# Patient Record
Sex: Female | Born: 1975 | Race: White | Hispanic: No | Smoking: Current every day smoker
Health system: Southern US, Community
[De-identification: ages and names within clinical notes are randomized; demographics above are authoritative.]

## PROBLEM LIST (undated history)

## (undated) DIAGNOSIS — B009 Herpesviral infection, unspecified: Secondary | ICD-10-CM

## (undated) DIAGNOSIS — K829 Disease of gallbladder, unspecified: Secondary | ICD-10-CM

## (undated) HISTORY — DX: Disease of gallbladder, unspecified: K82.9

## (undated) HISTORY — PX: TONSILLECTOMY: SUR1361

## (undated) HISTORY — PX: OTHER SURGICAL HISTORY: SHX169

## (undated) HISTORY — DX: Herpesviral infection, unspecified: B00.9

---

## 1998-01-29 ENCOUNTER — Other Ambulatory Visit: Admission: RE | Admit: 1998-01-29 | Discharge: 1998-01-29 | Payer: Self-pay | Admitting: *Deleted

## 1998-02-05 ENCOUNTER — Other Ambulatory Visit: Admission: RE | Admit: 1998-02-05 | Discharge: 1998-02-05 | Payer: Self-pay | Admitting: Gynecology

## 1998-05-17 ENCOUNTER — Other Ambulatory Visit: Admission: RE | Admit: 1998-05-17 | Discharge: 1998-05-17 | Payer: Self-pay | Admitting: Gynecology

## 1998-12-24 ENCOUNTER — Other Ambulatory Visit: Admission: RE | Admit: 1998-12-24 | Discharge: 1998-12-24 | Payer: Self-pay | Admitting: Obstetrics and Gynecology

## 1999-05-15 ENCOUNTER — Other Ambulatory Visit: Admission: RE | Admit: 1999-05-15 | Discharge: 1999-05-15 | Payer: Self-pay | Admitting: Obstetrics and Gynecology

## 1999-08-15 ENCOUNTER — Other Ambulatory Visit: Admission: RE | Admit: 1999-08-15 | Discharge: 1999-08-15 | Payer: Self-pay | Admitting: Obstetrics and Gynecology

## 2000-01-05 ENCOUNTER — Other Ambulatory Visit: Admission: RE | Admit: 2000-01-05 | Discharge: 2000-01-05 | Payer: Self-pay | Admitting: Obstetrics and Gynecology

## 2001-01-25 ENCOUNTER — Other Ambulatory Visit: Admission: RE | Admit: 2001-01-25 | Discharge: 2001-01-25 | Payer: Self-pay | Admitting: Obstetrics and Gynecology

## 2002-01-25 ENCOUNTER — Other Ambulatory Visit: Admission: RE | Admit: 2002-01-25 | Discharge: 2002-01-25 | Payer: Self-pay | Admitting: Obstetrics and Gynecology

## 2003-01-23 ENCOUNTER — Other Ambulatory Visit: Admission: RE | Admit: 2003-01-23 | Discharge: 2003-01-23 | Payer: Self-pay | Admitting: Obstetrics and Gynecology

## 2004-01-25 ENCOUNTER — Other Ambulatory Visit: Admission: RE | Admit: 2004-01-25 | Discharge: 2004-01-25 | Payer: Self-pay | Admitting: Obstetrics and Gynecology

## 2005-01-26 ENCOUNTER — Other Ambulatory Visit: Admission: RE | Admit: 2005-01-26 | Discharge: 2005-01-26 | Payer: Self-pay | Admitting: Obstetrics and Gynecology

## 2006-11-17 ENCOUNTER — Ambulatory Visit (HOSPITAL_COMMUNITY): Admission: RE | Admit: 2006-11-17 | Discharge: 2006-11-18 | Payer: Self-pay | Admitting: Orthopedic Surgery

## 2007-03-08 ENCOUNTER — Other Ambulatory Visit: Admission: RE | Admit: 2007-03-08 | Discharge: 2007-03-08 | Payer: Self-pay | Admitting: Gynecology

## 2008-03-08 ENCOUNTER — Other Ambulatory Visit: Admission: RE | Admit: 2008-03-08 | Discharge: 2008-03-08 | Payer: Self-pay | Admitting: Gynecology

## 2008-09-12 ENCOUNTER — Other Ambulatory Visit: Admission: RE | Admit: 2008-09-12 | Discharge: 2008-09-12 | Payer: Self-pay | Admitting: Gynecology

## 2008-09-12 ENCOUNTER — Ambulatory Visit: Payer: Self-pay | Admitting: Gynecology

## 2008-09-12 ENCOUNTER — Encounter: Payer: Self-pay | Admitting: Gynecology

## 2009-10-22 ENCOUNTER — Other Ambulatory Visit: Admission: RE | Admit: 2009-10-22 | Discharge: 2009-10-22 | Payer: Self-pay | Admitting: Gynecology

## 2009-10-22 ENCOUNTER — Ambulatory Visit: Payer: Self-pay | Admitting: Gynecology

## 2010-10-24 ENCOUNTER — Other Ambulatory Visit: Payer: Self-pay | Admitting: Gynecology

## 2010-10-24 ENCOUNTER — Encounter (INDEPENDENT_AMBULATORY_CARE_PROVIDER_SITE_OTHER): Payer: BLUE CROSS/BLUE SHIELD | Admitting: Gynecology

## 2010-10-24 ENCOUNTER — Other Ambulatory Visit (HOSPITAL_COMMUNITY)
Admission: RE | Admit: 2010-10-24 | Discharge: 2010-10-24 | Disposition: A | Discharge status: Home / Self Care | Payer: BLUE CROSS/BLUE SHIELD | Source: Ambulatory Visit | Attending: Gynecology | Admitting: Gynecology

## 2010-10-24 DIAGNOSIS — Z01419 Encounter for gynecological examination (general) (routine) without abnormal findings: Secondary | ICD-10-CM

## 2010-10-24 DIAGNOSIS — Z13220 Encounter for screening for lipoid disorders: Secondary | ICD-10-CM

## 2010-10-24 DIAGNOSIS — Z124 Encounter for screening for malignant neoplasm of cervix: Secondary | ICD-10-CM | POA: Insufficient documentation

## 2010-10-24 DIAGNOSIS — Z113 Encounter for screening for infections with a predominantly sexual mode of transmission: Secondary | ICD-10-CM

## 2010-11-28 NOTE — Op Note (Signed)
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   OPERATIVE PROCEDURE:  Microlaminectomy for diskectomy, L5-S1, CPT code  19147.   COMPLICATIONS:  None.   CONDITION:  Stable.   ESTIMATED BLOOD LOSS:  Minimal blood loss.   HISTORY:  This is a very pleasant, 35 year old woman who complains of  ongoing intermittent low back pain.  Then she developed severe  unrelenting left leg pain.  Attempts at conservative measures consisting  of physiotherapy and injection therapy failed to alleviate her leg pain.  An MRI confirmed the L5-S1 large posterolateral to the left disk  herniation.  After discussing treatment options, he elected to proceed  with surgery mainly because of the leg pain.  All appropriate risks,  benefits and alternatives were explained to the patient.  Consent was  obtained.   OPERATIVE NOTE:  Patient was brought to the operating room and placed  supine on the operating table.  After successful induction of general  anesthesia and endotracheal intubation, TEDs were applied.  She was  turned prone onto the [place] table.  She was then placed in the knee-  chest position with 90 degrees of flexion at the hip and knee.  All bony  prominences were well padded.  The arms were placed overhead and the  back was prepped and draped in sterile fashion.  Two needles were placed  and an x-ray was taken for marker incision placement.   Once we identified the L5-S1 disk space.  I made a small  incision in the  midline of the back.  Sharp dissection was carried out down to the deep  fascia.  The deep fascia was sharply incised and I then stripped the  periosteum to expose the L5 and a portion of the S1 spinous processes.  I then identified the facet capsule.   At this point in time, I placed a [place] 4 underneath the lamina of  what I thought was L5 and took an x-ray.  It turns out it was at L4, so  I migrated inferiorly one level, put the [place] 4 underneath now what  I knew to be the L5 lamina, took a repeat x-ray and confirmed that I was  at the appropriate disk space level.   At this point in time, using a 4 mm Kerrison rongeur, I performed a  laminotomy of L5.  I then sharply incised the ligamentum flavum and then  developed a plane between the ligamentum flavum and underlying dura.  I  then used a 2 and 3 mm Kerrison to resect the ligamentum flavum  to  expose the underlying dura.  The nerve root and dura were displaced  posteriorly due to the very large posterolateral to the left disk  herniation.  This made sweeping the nerve root and dura medially quite  easy.  At this point I could directly visualize the large fragment of  disk material.  I then incised the overlying annulus and removed several  large fragments of disk material.  I then took a micropituitary rongeur  and passed it into the disk space and removed any loose fragments of  disk that were still within the disk space itself.  At this time I could  take a [facility] and freely pass inferiorly, superiorly, medially  and laterally and out the neuroforamen.  There was no impedance to  palpation and the nerve root itself was completely free of any further  undue tension or compression.   At this point I irrigated the wound copiously with normal saline,  checked again with a nerve root retractor as well as a [place] to  confirm that there were no fragments of disk material and then obtained   hemostasis using bipolar electrocautery and then FloSeal.  I then closed  the deep fascia with interrupted 0 Vicryl sutures, superficial with 2-0  Vicryl sutures and a 3-0 Monocryl on the skin.  Steri-Strips and a dry  dressing were applied.  She was then extubated and transferred to the  PACU without incident.  In the PACU she had a negative straight leg  raise test.  She was neurologically intact.      Alvy Beal, MD  Electronically Signed     DDB/MEDQ  D:  11/17/2006  T:  11/17/2006  Job:  [id number]

## 2011-11-25 ENCOUNTER — Telehealth: Payer: Self-pay | Admitting: *Deleted

## 2011-11-25 MED ORDER — VALACYCLOVIR HCL 500 MG PO TABS: 500.0000 mg | ORAL_TABLET | Freq: Two times a day (BID) | ORAL | Status: DC

## 2011-11-25 NOTE — Telephone Encounter (Signed)
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

## 2011-11-25 NOTE — Telephone Encounter (Signed)
Beltran 100 mg twice a day x5 days for HSV outbreak refill x2 #10

## 2011-11-25 NOTE — Telephone Encounter (Signed)
Pt informed with the below. 

## 2011-12-09 ENCOUNTER — Encounter: Payer: Self-pay | Admitting: *Deleted

## 2011-12-11 ENCOUNTER — Encounter: Payer: Self-pay | Admitting: Gynecology

## 2011-12-11 ENCOUNTER — Ambulatory Visit (INDEPENDENT_AMBULATORY_CARE_PROVIDER_SITE_OTHER): Payer: BC Managed Care – PPO | Admitting: Gynecology

## 2011-12-11 VITALS — BP 114/70 | Ht 58.75 in | Wt 85.0 lb

## 2011-12-11 DIAGNOSIS — Z13220 Encounter for screening for lipoid disorders: Secondary | ICD-10-CM

## 2011-12-11 DIAGNOSIS — Z01419 Encounter for gynecological examination (general) (routine) without abnormal findings: Secondary | ICD-10-CM

## 2011-12-11 DIAGNOSIS — Z113 Encounter for screening for infections with a predominantly sexual mode of transmission: Secondary | ICD-10-CM

## 2011-12-11 LAB — LIPID PANEL
HDL: 95 mg/dL (ref >39)
Triglycerides: 53 mg/dL (ref <150)

## 2011-12-11 NOTE — Patient Instructions (Signed)
Follow up in one year for annual exam 

## 2011-12-11 NOTE — Progress Notes (Signed)
Katherine Kidd 12-03-1975 [id number]        36 y.o.  for annual exam.  Several issues noted below  Past medical history,surgical history, medications, allergies, family history and social history were all reviewed and documented in the EPIC chart. ROS:  Was performed and pertinent positives and negatives are included in the history.  Exam: Sherrilyn Rist chaperone present Filed Vitals:   12/11/11 0859  BP: 114/70   General appearance  Normal Skin grossly normal Head/Neck normal with no cervical or supraclavicular adenopathy thyroid normal Lungs  clear Cardiac RR, without RMG Abdominal  soft, nontender, without masses, organomegaly or hernia Breasts  examined lying and sitting without masses, retractions, discharge or axillary adenopathy. Pelvic  Ext/BUS/vagina  normal   Cervix  normal GC Chlamydia screen  Uterus  anteverted, normal size, shape and contour, midline and mobile nontender   Adnexa  Without masses or tenderness    Anus and perineum  normal   Rectovaginal  normal sphincter tone without palpated masses or tenderness.    Assessment/Plan:  36 y.o. female for annual exam.    1. Recent miscarriage. Patient relates having regular menses then skipped in December, had a positive pregnancy test and then had heavy bleeding with resumption of normal regular monthly menses. Using withdrawal birth control. We discussed contraceptive options as in the past and she declines. She is in a relationship now with planned marriage and anticipates pregnancy relatively soon.  She is on a multivitamin with folic acid and will continue on this. She continues to smoke and again I reviewed stop smoking with her not only from a general health standpoint or from a pregnancy standpoint. 2. STD screening. Patient requests GC and Chlamydia screen. She has no known exposure but just wants to be screened. 3. Pap smear. No Pap smear was done today. Last Pap smear 2012. She has no history of abnormal Pap smears with  numerous normal records in her chart. We'll plan less frequent screening per current screening guidelines every 3-5 years. 4. Mammography. I discussed screening guidelines between 35 and 40. Patient has no strong family history prefers to wait closer to 40. SBE monthly reviewed. 5. Left axillary nodularity. Patient had a small sebaceous cyst in her left axillary area on exam last year which is gone this year. She'll continue with self exams and as long as it remains gone and will follow. 6. Health maintenance. She had a normal glucose and CBC last year.  Her cholesterol was borderline but her HDL was in the 90s and the remainder of her profile normal. We'll repeat lipid profile now along with urinalysis. Otherwise assuming she continues well from a gynecologic standpoint she will see me in a year, sooner as needed.   Dara Lords MD, 9:29 AM 12/11/2011

## 2011-12-12 LAB — URINALYSIS W MICROSCOPIC + REFLEX CULTURE
Casts: NONE SEEN
Hgb urine dipstick: NEGATIVE
Leukocytes, UA: NEGATIVE
Nitrite: NEGATIVE
Protein, ur: NEGATIVE mg/dL
pH: 5.5 (ref 5.0–8.0)

## 2011-12-12 LAB — GC/CHLAMYDIA PROBE AMP, GENITAL
Chlamydia, DNA Probe: NEGATIVE
GC Probe Amp, Genital: NEGATIVE

## 2012-02-29 ENCOUNTER — Telehealth: Payer: Self-pay | Admitting: *Deleted

## 2012-02-29 MED ORDER — VALACYCLOVIR HCL 500 MG PO TABS: 500.0000 mg | ORAL_TABLET | Freq: Every day | ORAL | Status: DC

## 2012-02-29 NOTE — Telephone Encounter (Signed)
Okay with Valtrex 500 mg daily #30 refill x6. If patient attempting pregnancy she needs to stop this before she gets pregnant or as soon as she finds out she is pregnant

## 2012-02-29 NOTE — Telephone Encounter (Signed)
Pt informed with the below note. 

## 2012-02-29 NOTE — Telephone Encounter (Signed)
Pt is calling requesting to have suppressive therapy for her valtrex 500 mg tablet due to outbreaks. Please advise

## 2012-03-16 ENCOUNTER — Encounter: Payer: Self-pay | Admitting: Gynecology

## 2012-03-16 ENCOUNTER — Ambulatory Visit (INDEPENDENT_AMBULATORY_CARE_PROVIDER_SITE_OTHER): Payer: BC Managed Care – PPO | Admitting: Gynecology

## 2012-03-16 DIAGNOSIS — A499 Bacterial infection, unspecified: Secondary | ICD-10-CM

## 2012-03-16 DIAGNOSIS — N898 Other specified noninflammatory disorders of vagina: Secondary | ICD-10-CM

## 2012-03-16 DIAGNOSIS — L293 Anogenital pruritus, unspecified: Secondary | ICD-10-CM

## 2012-03-16 DIAGNOSIS — B9689 Other specified bacterial agents as the cause of diseases classified elsewhere: Secondary | ICD-10-CM

## 2012-03-16 DIAGNOSIS — N760 Acute vaginitis: Secondary | ICD-10-CM

## 2012-03-16 LAB — WET PREP FOR TRICH, YEAST, CLUE

## 2012-03-16 MED ORDER — TINIDAZOLE 500 MG PO TABS: 2.0000 g | ORAL_TABLET | Freq: Once | ORAL | Status: AC

## 2012-03-16 MED ORDER — FLUCONAZOLE 200 MG PO TABS: 200.0000 mg | ORAL_TABLET | Freq: Every day | ORAL | Status: AC

## 2012-03-16 NOTE — Progress Notes (Signed)
Patient presents with several days of vaginal itching. Used OTC antifungal but symptoms seem worse.  Past medical history,surgical history, medications, allergies, family history and social history were all reviewed and documented in the EPIC chart. ROS:  Was performed and pertinent positives and negatives are included in the history.  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 Cervix normal. Uterus normal sized and mobile nontender. Adnexa without masses or tenderness.  Assessment and plan: Exam, history and wet prep consistent with BV. We'll treat with Tindamax 2 g daily x2 doses, alcohol avoidance reviewed. Given the itching and she is going away tomorrow on a vacation going to cover her with Diflucan 200 mg daily x2 days for yeast. Follow up if symptoms persist or recur.

## 2012-03-16 NOTE — Patient Instructions (Addendum)
Take Tindamax 4 pills one day and 4 pills next day. Avoid alcohol. Take Diflucan one pill daily for 2 days. Follow up if symptoms persist or recur.

## 2012-11-30 ENCOUNTER — Other Ambulatory Visit: Payer: Self-pay | Admitting: Gastroenterology

## 2012-11-30 DIAGNOSIS — R1011 Right upper quadrant pain: Secondary | ICD-10-CM

## 2012-11-30 DIAGNOSIS — R1013 Epigastric pain: Secondary | ICD-10-CM

## 2012-12-07 ENCOUNTER — Ambulatory Visit
Admission: RE | Admit: 2012-12-07 | Discharge: 2012-12-07 | Disposition: A | Discharge status: Home / Self Care | Payer: BC Managed Care – PPO | Source: Ambulatory Visit | Attending: Gastroenterology | Admitting: Gastroenterology

## 2012-12-07 DIAGNOSIS — R1011 Right upper quadrant pain: Secondary | ICD-10-CM

## 2012-12-07 DIAGNOSIS — R1013 Epigastric pain: Secondary | ICD-10-CM

## 2012-12-16 ENCOUNTER — Encounter: Payer: Self-pay | Admitting: Gynecology

## 2012-12-26 ENCOUNTER — Ambulatory Visit (INDEPENDENT_AMBULATORY_CARE_PROVIDER_SITE_OTHER): Payer: BC Managed Care – PPO | Admitting: Gynecology

## 2012-12-26 ENCOUNTER — Encounter: Payer: Self-pay | Admitting: Gynecology

## 2012-12-26 VITALS — BP 110/66 | Ht <= 58 in | Wt 83.0 lb

## 2012-12-26 DIAGNOSIS — Z13220 Encounter for screening for lipoid disorders: Secondary | ICD-10-CM

## 2012-12-26 DIAGNOSIS — A6000 Herpesviral infection of urogenital system, unspecified: Secondary | ICD-10-CM

## 2012-12-26 DIAGNOSIS — N63 Unspecified lump in unspecified breast: Secondary | ICD-10-CM

## 2012-12-26 DIAGNOSIS — Z01419 Encounter for gynecological examination (general) (routine) without abnormal findings: Secondary | ICD-10-CM

## 2012-12-26 LAB — CBC WITH DIFFERENTIAL/PLATELET
Basophils Absolute: 0 10*3/uL (ref 0.0–0.1)
Basophils Relative: 0 % (ref 0–1)
Eosinophils Absolute: 0.1 10*3/uL (ref 0.0–0.7)
Eosinophils Relative: 1 % (ref 0–5)
HCT: 38.6 % (ref 36.0–46.0)
Hemoglobin: 13.1 g/dL (ref 12.0–15.0)
Lymphocytes Relative: 40 % (ref 12–46)
Monocytes Absolute: 0.8 10*3/uL (ref 0.1–1.0)
Monocytes Relative: 9 % (ref 3–12)
Neutro Abs: 4.7 10*3/uL (ref 1.7–7.7)
RBC: 4.11 MIL/uL (ref 3.87–5.11)
WBC: 9.3 10*3/uL (ref 4.0–10.5)

## 2012-12-26 MED ORDER — VALACYCLOVIR HCL 500 MG PO TABS: 500.0000 mg | ORAL_TABLET | Freq: Every day | ORAL | Status: DC

## 2012-12-26 NOTE — Progress Notes (Addendum)
Patient ID: Katherine Kidd, female   DOB: 1975-12-02, 37 y.o.   MRN: [id number] Katherine Kidd 1975-07-28 [id number]        37 y.o.  G1P0010 for annual exam.  Several issues noted below.  Past medical history,surgical history, medications, allergies, family history and social history were all reviewed and documented in the EPIC chart.  ROS:  Performed and pertinent positives and negatives are included in the history, assessment and plan .  Exam: Kim assistant Filed Vitals:   12/26/12 1548  BP: 110/66  Height: 4\' 10"  (1.473 m)  Weight: 83 lb (37.649 kg)   General appearance  Normal Skin grossly normal Head/Neck normal with no cervical or supraclavicular adenopathy thyroid normal Lungs  clear Cardiac RR, without RMG Abdominal  soft, nontender, without masses, organomegaly or hernia Breasts  examined lying and sitting.  Left without masses, retractions, discharge or axillary adenopathy.  Right 1 cm soft mass 2 fingerbreadths off the areola 10 to 11:00 position no overlying skin changes nipple discharge or axillary adenopathy. Physical Exam  Pulmonary/Chest:      Pelvic  Ext/BUS/vagina  normal   Cervix  normal   Uterus  anteverted, normal size, shape and contour, midline and mobile nontender   Adnexa  Without masses or tenderness    Anus and perineum  normal   Rectovaginal  normal sphincter tone without palpated masses or tenderness.    Assessment/Plan:  37 y.o. G38P0010 female for annual exam.   1. Right breast mass noted for 6 months. Overall unchanged during observation period. Start with mammogram ultrasound. Patient knows that we'll help arrange this and she will followup for this. 2. Contraception. Continues to use rhythm method. She understands and accepts the failure risk sent declines alternative birth control. Is getting married in 2 months. Recommended multivitamin with folic acid now. 3. Stop smoking discussed and encouraged. 4. Pap smear 2012. No Pap smear done today.  No history of significant abnormal Pap smears. Plan repeat Pap smear next year a 3 year interval. 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 I refilled her times a year. 6. Health maintenance. Baseline CBC comprehensive metabolic panel lipid profile urinalysis ordered. Being evaluated for gallbladder discomfort. Had negative ultrasound and is planning Hida scan. Followup for mammogram ultrasound otherwise one year, sooner as needed.     Dara Lords MD, 4:12 PM 12/26/2012

## 2012-12-26 NOTE — Patient Instructions (Signed)
Office will contact you to schedule mammogram and ultrasound of the breast.

## 2012-12-27 ENCOUNTER — Telehealth: Payer: Self-pay | Admitting: *Deleted

## 2012-12-27 DIAGNOSIS — N6310 Unspecified lump in the right breast, unspecified quadrant: Secondary | ICD-10-CM

## 2012-12-27 LAB — COMPREHENSIVE METABOLIC PANEL
ALT: 12 U/L (ref 0–35)
Alkaline Phosphatase: 52 U/L (ref 39–117)
CO2: 25 mEq/L (ref 19–32)
Creat: 0.62 mg/dL (ref 0.50–1.10)
Sodium: 137 mEq/L (ref 135–145)
Total Bilirubin: 0.5 mg/dL (ref 0.3–1.2)

## 2012-12-27 LAB — URINALYSIS W MICROSCOPIC + REFLEX CULTURE
Bacteria, UA: NONE SEEN
Bilirubin Urine: NEGATIVE
Ketones, ur: NEGATIVE mg/dL
Nitrite: NEGATIVE
Protein, ur: NEGATIVE mg/dL
Urobilinogen, UA: 0.2 mg/dL (ref 0.0–1.0)

## 2012-12-27 LAB — LIPID PANEL
Cholesterol: 160 mg/dL (ref 0–200)
LDL Cholesterol: 62 mg/dL (ref 0–99)
Total CHOL/HDL Ratio: 1.8 Ratio
VLDL: 9 mg/dL (ref 0–40)

## 2012-12-27 NOTE — Telephone Encounter (Signed)
ORDERS PLACE FOR BREAST CENTER THEY WILL CONTACT PT WITH TIME AND DATE.

## 2012-12-27 NOTE — Telephone Encounter (Signed)
Message copied by Aura Camps on Tue Dec 27, 2012 12:00 PM ------      Message from: Dara Lords      Created: Mon Dec 26, 2012  8:38 PM       Schedule Mammogram/ultrasound at breast center re: 1 cm right breast mass 10-11oclock position 2 fingers off areola. ------

## 2012-12-29 NOTE — Telephone Encounter (Signed)
appt 01/20/13 @ 9:10 am

## 2012-12-30 ENCOUNTER — Emergency Department: Payer: Self-pay | Admitting: Emergency Medicine

## 2013-01-20 ENCOUNTER — Other Ambulatory Visit: Payer: BC Managed Care – PPO

## 2013-01-27 ENCOUNTER — Ambulatory Visit
Admission: RE | Admit: 2013-01-27 | Discharge: 2013-01-27 | Disposition: A | Discharge status: Home / Self Care | Payer: BC Managed Care – PPO | Source: Ambulatory Visit | Attending: Gynecology | Admitting: Gynecology

## 2013-01-27 DIAGNOSIS — N6310 Unspecified lump in the right breast, unspecified quadrant: Secondary | ICD-10-CM

## 2013-06-30 ENCOUNTER — Telehealth: Payer: Self-pay | Admitting: *Deleted

## 2013-06-30 DIAGNOSIS — O3680X0 Pregnancy with inconclusive fetal viability, not applicable or unspecified: Secondary | ICD-10-CM

## 2013-06-30 NOTE — Telephone Encounter (Signed)
Pt found out she is about [redacted] weeks pregnant, LMP:Nov 18 th, c/o some very light brownish discharge noticed last night, light back pain. I explained not uncommon to have some discharge,but I would also relay information to you as well. Please advise

## 2013-06-30 NOTE — Telephone Encounter (Signed)
Pt informed with the below note, order placed for ultrasound.

## 2013-06-30 NOTE — Telephone Encounter (Signed)
Recommend following at present. Suggest scheduling ultrasound in 2 weeks which would make her around 6 weeks for viability check. Call back if heavier bleeding or more pain.

## 2013-07-14 ENCOUNTER — Encounter: Payer: Self-pay | Admitting: Gynecology

## 2013-07-14 ENCOUNTER — Ambulatory Visit (INDEPENDENT_AMBULATORY_CARE_PROVIDER_SITE_OTHER): Payer: BC Managed Care – PPO

## 2013-07-14 ENCOUNTER — Ambulatory Visit (INDEPENDENT_AMBULATORY_CARE_PROVIDER_SITE_OTHER): Payer: BC Managed Care – PPO | Admitting: Gynecology

## 2013-07-14 DIAGNOSIS — N912 Amenorrhea, unspecified: Secondary | ICD-10-CM

## 2013-07-14 DIAGNOSIS — O3680X0 Pregnancy with inconclusive fetal viability, not applicable or unspecified: Secondary | ICD-10-CM

## 2013-07-14 DIAGNOSIS — O200 Threatened abortion: Secondary | ICD-10-CM

## 2013-07-14 DIAGNOSIS — Z8759 Personal history of other complications of pregnancy, childbirth and the puerperium: Principal | ICD-10-CM

## 2013-07-14 LAB — US OB TRANSVAGINAL

## 2013-07-14 NOTE — Progress Notes (Signed)
Patient presents for viability ultrasound. She is 6 weeks by LMP and had called 2 weeks ago noting some spotting which has resolved.  She's having no cramping or bleeding. She was scheduled for an ultrasound now at 6 weeks. 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 She was to followup in 6 months which is now for repeat imaging.  Ultrasound shows viable IUP at 6 weeks. No evidence of bleeding. Cervix long and closed. Patient [facility] [place] [facility] [place] [facility] [place] [facility] [place] [facility] within the next 4 weeks and has a copy of the ultrasound report. I did not reexamine her given that she is not bleeding. She also notes the nodularity that she had felt previously in the right tail of Mliss SaxSpence is gone. I recommended that we'll contact the mammogram facility and arrange for a followup ultrasound just to relook at the retroareolar area but would hold on the mammogram at this time and she agrees with this. She is on a prenatal vitamin every day we'll continue to do so. She is attempting to stop smoking and I encouraged her to do so.

## 2013-07-14 NOTE — Patient Instructions (Signed)
Followup with obstetrician within the next 4 weeks. Continue on the prenatal vitamin. Continue with attempting to stop smoking.

## 2013-07-17 ENCOUNTER — Telehealth: Payer: Self-pay | Admitting: *Deleted

## 2013-07-17 DIAGNOSIS — Z09 Encounter for follow-up examination after completed treatment for conditions other than malignant neoplasm: Secondary | ICD-10-CM

## 2013-07-17 NOTE — Telephone Encounter (Signed)
Message copied by Aura CampsWEBB, JENNIFER L on Mon Jul 17, 2013  9:10 AM ------      Message from: Dara LordsFONTAINE, TIMOTHY P      Created: Fri Jul 14, 2013  3:19 PM       Arrange with radiology for a followup ultrasound right breast. They had talked about doing repeat imaging at 6 months from July but she now is early pregnant and I do not think a mammogram at this point should be done but they can relook at the area with ultrasound. ------

## 2013-07-17 NOTE — Telephone Encounter (Signed)
Orders placed for breast center they will contact patient. 

## 2013-07-20 NOTE — Telephone Encounter (Signed)
Appointment 07/21/13

## 2013-07-21 ENCOUNTER — Ambulatory Visit
Admission: RE | Admit: 2013-07-21 | Discharge: 2013-07-21 | Disposition: A | Discharge status: Home / Self Care | Payer: BC Managed Care – PPO | Source: Ambulatory Visit | Attending: Gynecology | Admitting: Gynecology

## 2013-07-21 DIAGNOSIS — Z09 Encounter for follow-up examination after completed treatment for conditions other than malignant neoplasm: Secondary | ICD-10-CM

## 2013-08-16 ENCOUNTER — Other Ambulatory Visit: Payer: Self-pay

## 2013-08-16 LAB — OB RESULTS CONSOLE HIV ANTIBODY (ROUTINE TESTING): HIV: NONREACTIVE

## 2013-08-16 LAB — OB RESULTS CONSOLE ABO/RH: RH Type: POSITIVE

## 2013-08-16 LAB — OB RESULTS CONSOLE RPR: RPR: NONREACTIVE

## 2013-08-16 LAB — OB RESULTS CONSOLE HEPATITIS B SURFACE ANTIGEN: Hepatitis B Surface Ag: NEGATIVE

## 2013-08-16 LAB — OB RESULTS CONSOLE ANTIBODY SCREEN: ANTIBODY SCREEN: NEGATIVE

## 2013-08-16 LAB — OB RESULTS CONSOLE RUBELLA ANTIBODY, IGM: Rubella: IMMUNE

## 2013-08-16 LAB — OB RESULTS CONSOLE GC/CHLAMYDIA
Chlamydia: NEGATIVE
GC PROBE AMP, GENITAL: NEGATIVE

## 2013-08-30 ENCOUNTER — Other Ambulatory Visit (HOSPITAL_COMMUNITY): Payer: Self-pay | Admitting: Obstetrics and Gynecology

## 2013-08-30 ENCOUNTER — Encounter (HOSPITAL_COMMUNITY): Payer: Self-pay

## 2013-08-30 ENCOUNTER — Ambulatory Visit (HOSPITAL_COMMUNITY)
Admission: RE | Admit: 2013-08-30 | Discharge: 2013-08-30 | Disposition: A | Discharge status: Home / Self Care | Payer: BC Managed Care – PPO | Source: Ambulatory Visit | Attending: Obstetrics and Gynecology | Admitting: Obstetrics and Gynecology

## 2013-08-30 VITALS — BP 131/67 | HR 100 | Wt 97.0 lb

## 2013-08-30 DIAGNOSIS — IMO0002 Reserved for concepts with insufficient information to code with codable children: Secondary | ICD-10-CM

## 2013-08-30 DIAGNOSIS — O99330 Smoking (tobacco) complicating pregnancy, unspecified trimester: Secondary | ICD-10-CM | POA: Insufficient documentation

## 2013-08-30 DIAGNOSIS — O09529 Supervision of elderly multigravida, unspecified trimester: Secondary | ICD-10-CM | POA: Insufficient documentation

## 2013-08-30 DIAGNOSIS — O358XX0 Maternal care for other (suspected) fetal abnormality and damage, not applicable or unspecified: Secondary | ICD-10-CM | POA: Insufficient documentation

## 2013-09-01 NOTE — Progress Notes (Signed)
Genetic Counseling  High-Risk Gestation Note  Appointment Date:  09/01/2013 Referred By: Cheri Fowler, MD Date of Birth:  03-19-1976 Partner:  Octavia Bruckner   Pregnancy History: G2P0010 Estimated Date of Delivery: 03/06/14 Estimated Gestational Age: [redacted]w[redacted]d I met with Mrs. CKenna Gilbertfor genetic counseling regarding an abnormal ultrasound finding.  Mrs. Guldin's had a routine nuchal translucency ultrasound through her primary obstetrician's office, which revealed an increased nuchal translucency versus a cystic hygroma.  [facility], and genetic counseling.  A limited ultrasound was performed today.  The report will be documented separately.  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  Mrs. Ciotti was counseled that a cystic hygroma describes a septated fluid filled sac at the back of the neck that typically results from failure of the fetal lymphatic system.  We discussed the various etiologies for a hygroma including normal variation (immature lymphatic system), a chromosome condition, single gene condition, or a congenital anomaly (heart defect).  We discussed chromosomes, non-disjunction, age related risks for aneuploidy (1%), and the 50% ultrasound adjusted risk for a chromosome condition based on the finding of a cystic hygroma.  We discussed the common features of Down syndrome, Turner syndrome, and trisomies 13 and 18.  Mrs. Cue previously had noninvasive prenatal screening (NIPS)/cell free fetal DNA (cffDNA) testing through her obstetrician's office.  We reviewed that while this testing has a high sensitivity and specificity, it is not considered to be diagnostic.  For trisomy 232and trisomy 1101 the detection rate is 99%; for trisomy 18 the detection rate is 96%, and for Turner syndrome, the  detection rate is ~92%.  We reviewed Mrs. Depriest's normal Panorama results and the associated significant reduction in risks for fetal aneuploidy.  We also discussed that cystic hygromas can also be caused by a variety of other chromosome aberrations including: microdeletions, microduplications, and translocations.  She was counseled that NIPS/cffDNA testing can detect a few microdeletions, but not those most commonly associated with cystic hygromas or abnormal nuchal translucency measurements.  We reviewed the availability of diagnostic testing (chromosome analysis) via [facility] and amniocentesis.  She was counseled regarding the associated risks for complications for each.  Additionally, we discussed that microarray analysis can be performed on cells obtained from amniocentesis.  Mrs. Colegrove was counseled that microarray analysis is a molecular based technique in which a test sample of DNA (fetal) is compared to a reference (normal) genome in order to determine if the test sample has any extra or missing genetic information.  Microarray analysis allows for the detection of genetic deletions and duplications that are 1270times smaller than those identified by routine chromosome analysis.  We discussed that recent publications show that approximately 6% of patients with an abnormal fetal ultrasound and a normal fetal karyotype had a significant microdeletion/microduplication detected by prenatal microarray analysis.  After consideration of all the options, Mrs. LAmakerexpressed interest in having an amniocentesis for both chromosome and microarray analysis.  She was scheduled to return on September 15, 2013 for ultrasound and amniocentesis.    We also discussed that a cystic hygroma can be a feature of many underlying single gene conditions, such as Noonan syndrome, SLOS, and skeletal dysplasias.  We discussed that prenatal diagnosis is available for some single gene conditions, but that in most cases targeted analysis is  recommended by a  medical geneticist following a post-natal physical exam and review of medical records.  If however, ultrasound findings or a positive family history strongly suggest an increased risk for a specific condition, testing may be performed prenatally.  Mrs. Dezeeuw was scheduled to have an ultrasound on the day of her amniocentesis and will return for a more detailed ultrasound at ~[redacted] weeks gestation.  If a specific condition is suspected, we discussed that single gene testing can be performed with DNA isolated from the cells obtained from the amniocentesis.  Additionally, we discussed that congenital heart defects (CHD) are commonly associated with cystic hygromas.  The prevalence of a CHD is in the order of 6 times higher in fetuses with a cystic hygroma/increased nuchal translucency as compared to an unselected population.  There does not appear to be an association between any specific CHD and cystic hygromas.   We discussed that the chance for a CHD increases exponentially with increasing NT measurements.  For this reason, as well as the finding of a SUA, we discussed the option of a fetal echocardiogram.  She was scheduled to have an early ([redacted] weeks gestation) fetal echocardiogram on September 20, 2013 at 2:00.  She understands that the fetal heart may be difficult to fully visualize at that gestational age, and that she will likely need a follow-up appointment later in the pregnancy.      We discussed that the fetal prognosis is largely dependent upon the underlying cause of the hygroma, but that there are indications by ultrasound, such as hydrops, which significantly increase the likelihood of a poor prognosis.  She understands that cystic hygromas may worsen and progress to hydrops fetalis, improve and regress, or remain unchanged at follow up ultrasounds.    Mrs. Atiyeh was counseled that the majority of fetuses with a normal karyotype, normal detailed anatomy ultrasound, and normal fetal  echocardiogram will have an uneventful outcome.  She understands that while many of the causes of a cystic hygroma can be investigated during pregnancy, not all conditions and causes can be determined prenatally.    We discussed the option of continuing the pregnancy versus termination of pregnancy.  Mrs. Erhart wishes to investigate as many causes as possible before making a decision.    Both family histories were reviewed and found to be noncontributory for birth defects, mental retardation, and known genetic conditions. Without further information regarding the provided family history, an accurate genetic risk cannot be calculated. Further genetic counseling is warranted if more information is obtained.  Ms. Amburn was provided with written information regarding cystic fibrosis (CF) including the carrier frequency and incidence in the Caucasian population, the availability of carrier testing and prenatal diagnosis if indicated.  In addition, we discussed that CF is routinely screened for as part of the [place] newborn screening panel.  She declined testing today.   Ms. Manso denied exposure to environmental toxins or chemical agents. She denied the use of alcohol, tobacco or street drugs. She denied significant viral illnesses during the course of her pregnancy.    I counseled Mrs. Vandenberg regarding the above risks and available options.  The approximate face-to-face time with the genetic counselor was 62 minutes.  Sharyne Richters, MS Certified Genetic Counselor

## 2013-09-15 ENCOUNTER — Ambulatory Visit (HOSPITAL_COMMUNITY)
Admission: RE | Admit: 2013-09-15 | Discharge: 2013-09-15 | Disposition: A | Discharge status: Home / Self Care | Payer: BC Managed Care – PPO | Source: Ambulatory Visit | Attending: Obstetrics and Gynecology | Admitting: Obstetrics and Gynecology

## 2013-09-15 ENCOUNTER — Other Ambulatory Visit: Payer: Self-pay

## 2013-09-15 DIAGNOSIS — O358XX0 Maternal care for other (suspected) fetal abnormality and damage, not applicable or unspecified: Secondary | ICD-10-CM | POA: Insufficient documentation

## 2013-09-15 DIAGNOSIS — O99330 Smoking (tobacco) complicating pregnancy, unspecified trimester: Secondary | ICD-10-CM | POA: Insufficient documentation

## 2013-09-15 DIAGNOSIS — IMO0002 Reserved for concepts with insufficient information to code with codable children: Secondary | ICD-10-CM

## 2013-09-15 DIAGNOSIS — O09529 Supervision of elderly multigravida, unspecified trimester: Secondary | ICD-10-CM | POA: Insufficient documentation

## 2013-09-15 LAB — AP-AFP (ALPHA FETOPROTEIN)

## 2013-09-15 LAB — ROUTINE CHROMOSOME - KARYOTYPE + FISH

## 2013-09-20 ENCOUNTER — Telehealth (HOSPITAL_COMMUNITY): Payer: Self-pay

## 2013-09-20 ENCOUNTER — Other Ambulatory Visit: Payer: Self-pay

## 2013-09-20 NOTE — Telephone Encounter (Signed)
Mrs. Damaris SchoonerLepore had an early fetal echo today and called to let me know that the results were normal.  The heart was well visualized and no follow up was requested.  Report pending.  Mrs. Paschen does not wish to terminate at this time.  She wishes to await the microarray results and the detailed anatomy u/s.  Discussed that [facility] today and that when they call to schedule the D&E, she can let them know that she has changed her mind.  All questions answered.  She was encouraged to call with additional questions/ concerns.  Donald Prosehristy S. Kalyssa Anker, MS Certified Genetic Counselor

## 2013-09-20 NOTE — Telephone Encounter (Signed)
Pt. Called today inquiring about options for TAB.  She is concerned that the cystic hygroma plus the new u/s finding of clubfeet is concerning for an underlying genetic condition.  [facility], as expected based on the nl Panorama results, but that normal FISH results do not rule out all genetic conditions.  We reviewed that the final karyotype and microarray analysis can evaluate the fetus for other chromosome aberrations, but not specific single gene conditions.  We discussed that the echo on Wednesday, 09/20/13, may shed more light on whether or not the cystic hygroma was the result of a CHD, and that the detailed anatomy ultrasound can help evaluate the fetus for specific single gene conditions.  If features by u/s are suggestive of a specific condition at that time, further testing can be considered. We discussed that it will likely take many weeks to gather this information, and that even then we may not have a specific diagnosis or understand the cause of the fetal differences.  Katherine Kidd was understandably upset and asked many questions.  [facility] (lower cost due to procedure being performed outside of a hospital setting).  She is going to move forward with the echo on 09/20/13.  She is not certain whether or not she wishes to await the karyotype/microarray results before scheduling a TOP.  I contacted [facility], at Katherine Kidd's request, and provided the patient's information.  They will call her to schedule the procedure.  She will decide at that time whether or not she wishes to move forward with continuing or TOP.  She very much wants to have a plan in place, but is not sure what she wants to do. I encouraged her to gather as much information as possible to assist her in her decision making.  All questions answered. The patient was encouraged to call with any additional questions/concerns.   Donald Prosehristy S. Venda Dice, MS Certified Genetic Counselor

## 2013-09-28 ENCOUNTER — Other Ambulatory Visit (HOSPITAL_COMMUNITY): Payer: Self-pay | Admitting: Obstetrics and Gynecology

## 2013-09-28 DIAGNOSIS — O358XX0 Maternal care for other (suspected) fetal abnormality and damage, not applicable or unspecified: Secondary | ICD-10-CM

## 2013-09-28 DIAGNOSIS — O99340 Other mental disorders complicating pregnancy, unspecified trimester: Secondary | ICD-10-CM

## 2013-09-28 DIAGNOSIS — O09529 Supervision of elderly multigravida, unspecified trimester: Secondary | ICD-10-CM

## 2013-09-29 ENCOUNTER — Telehealth (HOSPITAL_COMMUNITY): Payer: Self-pay

## 2013-09-29 NOTE — Telephone Encounter (Signed)
Left message for patient with her normal fetal chromosome analysis results (via amniocentesis).  Encouraged patient to call with questions or concerns.  U/s next week and microarray results pending. Despina Ariashristy Zaryiah Barz, MS Certified Genetic Counselor

## 2013-10-02 ENCOUNTER — Other Ambulatory Visit: Payer: Self-pay

## 2013-10-05 ENCOUNTER — Ambulatory Visit (HOSPITAL_COMMUNITY)
Admission: RE | Admit: 2013-10-05 | Discharge: 2013-10-05 | Disposition: A | Discharge status: Home / Self Care | Payer: BC Managed Care – PPO | Source: Ambulatory Visit | Attending: Obstetrics and Gynecology | Admitting: Obstetrics and Gynecology

## 2013-10-05 DIAGNOSIS — O99330 Smoking (tobacco) complicating pregnancy, unspecified trimester: Secondary | ICD-10-CM | POA: Insufficient documentation

## 2013-10-05 DIAGNOSIS — O09529 Supervision of elderly multigravida, unspecified trimester: Secondary | ICD-10-CM

## 2013-10-05 DIAGNOSIS — O99340 Other mental disorders complicating pregnancy, unspecified trimester: Secondary | ICD-10-CM

## 2013-10-05 DIAGNOSIS — O358XX0 Maternal care for other (suspected) fetal abnormality and damage, not applicable or unspecified: Secondary | ICD-10-CM

## 2013-10-12 ENCOUNTER — Telehealth (HOSPITAL_COMMUNITY): Payer: Self-pay

## 2013-10-12 NOTE — Telephone Encounter (Signed)
Called Katherine Kidd to discuss her prenatal microarray results.  Testing was offered because of abnormal ultrasound findings following a normal fetal karyotype.   The patient was identified by name and DOB.  We reviewed that these are within normal limits.  No microdeletions or microduplications were detected.  We discussed that this technology can identify many, but not all, chromosome aberrations.  We also discussed that this technology does not identify single gene conditions. [name] has a f/u ultrasound on October 19, 2013.  We discussed that no single gene condition is recognized at this time.  The molecular lab will isolate and store fetal DNA, so that further testing can be performed at a later date, if indicated.  All questions were answered to her satisfaction, she was encouraged to call with additional questions or concerns.  Despina AriasSTANLEY, Tamella Tuccillo, MS Certified Genetic Counselor

## 2013-10-13 ENCOUNTER — Other Ambulatory Visit (HOSPITAL_COMMUNITY): Payer: Self-pay | Admitting: Obstetrics and Gynecology

## 2013-10-13 DIAGNOSIS — O358XX0 Maternal care for other (suspected) fetal abnormality and damage, not applicable or unspecified: Secondary | ICD-10-CM

## 2013-10-13 DIAGNOSIS — O99340 Other mental disorders complicating pregnancy, unspecified trimester: Secondary | ICD-10-CM

## 2013-10-13 DIAGNOSIS — O09529 Supervision of elderly multigravida, unspecified trimester: Secondary | ICD-10-CM

## 2013-10-19 ENCOUNTER — Ambulatory Visit (HOSPITAL_COMMUNITY)
Admission: RE | Admit: 2013-10-19 | Discharge: 2013-10-19 | Disposition: A | Discharge status: Home / Self Care | Payer: BC Managed Care – PPO | Source: Ambulatory Visit | Attending: Obstetrics and Gynecology | Admitting: Obstetrics and Gynecology

## 2013-10-19 VITALS — BP 107/62 | HR 95 | Wt 99.0 lb

## 2013-10-19 DIAGNOSIS — O09529 Supervision of elderly multigravida, unspecified trimester: Secondary | ICD-10-CM | POA: Insufficient documentation

## 2013-10-19 DIAGNOSIS — O358XX0 Maternal care for other (suspected) fetal abnormality and damage, not applicable or unspecified: Secondary | ICD-10-CM

## 2013-10-19 DIAGNOSIS — O99330 Smoking (tobacco) complicating pregnancy, unspecified trimester: Secondary | ICD-10-CM | POA: Insufficient documentation

## 2013-10-19 DIAGNOSIS — O99340 Other mental disorders complicating pregnancy, unspecified trimester: Secondary | ICD-10-CM

## 2013-11-17 ENCOUNTER — Ambulatory Visit (HOSPITAL_COMMUNITY)
Admission: RE | Admit: 2013-11-17 | Discharge: 2013-11-17 | Disposition: A | Discharge status: Home / Self Care | Payer: BC Managed Care – PPO | Source: Ambulatory Visit | Attending: Maternal and Fetal Medicine | Admitting: Maternal and Fetal Medicine

## 2013-11-17 ENCOUNTER — Encounter (HOSPITAL_COMMUNITY): Payer: Self-pay

## 2013-11-17 DIAGNOSIS — Z3689 Encounter for other specified antenatal screening: Secondary | ICD-10-CM | POA: Insufficient documentation

## 2013-11-17 DIAGNOSIS — O358XX0 Maternal care for other (suspected) fetal abnormality and damage, not applicable or unspecified: Secondary | ICD-10-CM | POA: Insufficient documentation

## 2013-11-20 ENCOUNTER — Other Ambulatory Visit (HOSPITAL_COMMUNITY): Payer: Self-pay | Admitting: Obstetrics and Gynecology

## 2013-11-20 DIAGNOSIS — O99340 Other mental disorders complicating pregnancy, unspecified trimester: Secondary | ICD-10-CM

## 2013-11-20 DIAGNOSIS — O358XX0 Maternal care for other (suspected) fetal abnormality and damage, not applicable or unspecified: Secondary | ICD-10-CM

## 2013-11-20 DIAGNOSIS — O09529 Supervision of elderly multigravida, unspecified trimester: Secondary | ICD-10-CM

## 2013-12-15 ENCOUNTER — Other Ambulatory Visit (HOSPITAL_COMMUNITY): Payer: Self-pay | Admitting: Obstetrics and Gynecology

## 2013-12-15 ENCOUNTER — Ambulatory Visit (HOSPITAL_COMMUNITY)
Admission: RE | Admit: 2013-12-15 | Discharge: 2013-12-15 | Disposition: A | Discharge status: Home / Self Care | Payer: BC Managed Care – PPO | Source: Ambulatory Visit | Attending: Obstetrics and Gynecology | Admitting: Obstetrics and Gynecology

## 2013-12-15 ENCOUNTER — Encounter (HOSPITAL_COMMUNITY): Payer: Self-pay

## 2013-12-15 VITALS — BP 119/65 | HR 113 | Wt 106.0 lb

## 2013-12-15 DIAGNOSIS — O99340 Other mental disorders complicating pregnancy, unspecified trimester: Secondary | ICD-10-CM

## 2013-12-15 DIAGNOSIS — O09529 Supervision of elderly multigravida, unspecified trimester: Secondary | ICD-10-CM

## 2013-12-15 DIAGNOSIS — O358XX0 Maternal care for other (suspected) fetal abnormality and damage, not applicable or unspecified: Secondary | ICD-10-CM

## 2014-01-02 ENCOUNTER — Encounter (HOSPITAL_COMMUNITY): Payer: Self-pay

## 2014-01-15 ENCOUNTER — Other Ambulatory Visit (HOSPITAL_COMMUNITY): Payer: Self-pay | Admitting: Obstetrics and Gynecology

## 2014-01-15 ENCOUNTER — Ambulatory Visit (HOSPITAL_COMMUNITY)
Admission: RE | Admit: 2014-01-15 | Discharge: 2014-01-15 | Disposition: A | Discharge status: Home / Self Care | Payer: BC Managed Care – PPO | Source: Ambulatory Visit | Attending: Obstetrics and Gynecology | Admitting: Obstetrics and Gynecology

## 2014-01-15 ENCOUNTER — Encounter (HOSPITAL_COMMUNITY): Payer: Self-pay

## 2014-01-15 VITALS — BP 110/68 | HR 102 | Wt 111.0 lb

## 2014-01-15 DIAGNOSIS — O09529 Supervision of elderly multigravida, unspecified trimester: Secondary | ICD-10-CM

## 2014-01-15 DIAGNOSIS — O358XX0 Maternal care for other (suspected) fetal abnormality and damage, not applicable or unspecified: Secondary | ICD-10-CM | POA: Insufficient documentation

## 2014-01-15 DIAGNOSIS — O99340 Other mental disorders complicating pregnancy, unspecified trimester: Secondary | ICD-10-CM | POA: Insufficient documentation

## 2014-02-02 LAB — OB RESULTS CONSOLE GBS: STREP GROUP B AG: POSITIVE

## 2014-02-16 ENCOUNTER — Ambulatory Visit (HOSPITAL_COMMUNITY)
Admission: RE | Admit: 2014-02-16 | Discharge: 2014-02-16 | Disposition: A | Discharge status: Home / Self Care | Payer: BC Managed Care – PPO | Source: Ambulatory Visit | Attending: Obstetrics and Gynecology | Admitting: Obstetrics and Gynecology

## 2014-02-16 ENCOUNTER — Encounter (HOSPITAL_COMMUNITY): Payer: Self-pay

## 2014-02-16 VITALS — BP 136/70 | HR 96 | Wt 110.0 lb

## 2014-02-16 DIAGNOSIS — O358XX0 Maternal care for other (suspected) fetal abnormality and damage, not applicable or unspecified: Secondary | ICD-10-CM | POA: Insufficient documentation

## 2014-03-04 ENCOUNTER — Inpatient Hospital Stay (HOSPITAL_COMMUNITY)
Admission: AD | Admit: 2014-03-04 | Discharge: 2014-03-08 | DRG: 765 | Disposition: A | Discharge status: Home / Self Care | Payer: BC Managed Care – PPO | Source: Ambulatory Visit | Attending: Obstetrics and Gynecology | Admitting: Obstetrics and Gynecology

## 2014-03-04 ENCOUNTER — Encounter (HOSPITAL_COMMUNITY): Payer: Self-pay | Admitting: *Deleted

## 2014-03-04 DIAGNOSIS — Z22330 Carrier of Group B streptococcus: Secondary | ICD-10-CM | POA: Diagnosis not present

## 2014-03-04 DIAGNOSIS — O98519 Other viral diseases complicating pregnancy, unspecified trimester: Secondary | ICD-10-CM | POA: Diagnosis present

## 2014-03-04 DIAGNOSIS — O99334 Smoking (tobacco) complicating childbirth: Secondary | ICD-10-CM | POA: Diagnosis present

## 2014-03-04 DIAGNOSIS — Z833 Family history of diabetes mellitus: Secondary | ICD-10-CM | POA: Diagnosis not present

## 2014-03-04 DIAGNOSIS — O99892 Other specified diseases and conditions complicating childbirth: Secondary | ICD-10-CM | POA: Diagnosis present

## 2014-03-04 DIAGNOSIS — A6000 Herpesviral infection of urogenital system, unspecified: Secondary | ICD-10-CM | POA: Diagnosis present

## 2014-03-04 DIAGNOSIS — O9989 Other specified diseases and conditions complicating pregnancy, childbirth and the puerperium: Secondary | ICD-10-CM

## 2014-03-04 DIAGNOSIS — Z8249 Family history of ischemic heart disease and other diseases of the circulatory system: Secondary | ICD-10-CM | POA: Diagnosis not present

## 2014-03-04 DIAGNOSIS — O365990 Maternal care for other known or suspected poor fetal growth, unspecified trimester, not applicable or unspecified: Principal | ICD-10-CM | POA: Diagnosis present

## 2014-03-04 DIAGNOSIS — O359XX0 Maternal care for (suspected) fetal abnormality and damage, unspecified, not applicable or unspecified: Secondary | ICD-10-CM | POA: Diagnosis present

## 2014-03-04 DIAGNOSIS — O358XX0 Maternal care for other (suspected) fetal abnormality and damage, not applicable or unspecified: Secondary | ICD-10-CM | POA: Diagnosis present

## 2014-03-04 DIAGNOSIS — Z803 Family history of malignant neoplasm of breast: Secondary | ICD-10-CM

## 2014-03-04 LAB — CBC
HCT: 34.6 % — ABNORMAL LOW (ref 36.0–46.0)
Hemoglobin: 12.2 g/dL (ref 12.0–15.0)
MCH: 31.9 pg (ref 26.0–34.0)
MCHC: 35.3 g/dL (ref 30.0–36.0)
MCV: 90.3 fL (ref 78.0–100.0)
PLATELETS: 249 10*3/uL (ref 150–400)
RBC: 3.83 MIL/uL — ABNORMAL LOW (ref 3.87–5.11)
RDW: 13.6 % (ref 11.5–15.5)
WBC: 12.9 10*3/uL — ABNORMAL HIGH (ref 4.0–10.5)

## 2014-03-04 MED ORDER — LACTATED RINGERS IV SOLN
INTRAVENOUS | Status: DC
  Administered 2014-03-04 – 2014-03-06 (×8): via INTRAVENOUS

## 2014-03-04 MED ORDER — BUTORPHANOL TARTRATE 1 MG/ML IJ SOLN: 1.0000 mg | INTRAMUSCULAR | Status: DC | PRN

## 2014-03-04 MED ORDER — OXYTOCIN 40 UNITS IN LACTATED RINGERS INFUSION - SIMPLE MED: 62.5000 mL/h | INTRAVENOUS | Status: DC

## 2014-03-04 MED ORDER — TERBUTALINE SULFATE 1 MG/ML IJ SOLN: 0.2500 mg | Freq: Once | INTRAMUSCULAR | Status: AC | PRN

## 2014-03-04 MED ORDER — OXYTOCIN BOLUS FROM INFUSION: 500.0000 mL | INTRAVENOUS | Status: DC

## 2014-03-04 MED ORDER — LACTATED RINGERS IV SOLN
500.0000 mL | INTRAVENOUS | Status: DC | PRN
  Administered 2014-03-05: 300 mL via INTRAVENOUS
  Administered 2014-03-05: 500 mL via INTRAVENOUS

## 2014-03-04 MED ORDER — ZOLPIDEM TARTRATE 5 MG PO TABS: 5.0000 mg | ORAL_TABLET | Freq: Every evening | ORAL | Status: DC | PRN

## 2014-03-04 MED ORDER — LIDOCAINE HCL (PF) 1 % IJ SOLN: 30.0000 mL | INTRAMUSCULAR | Status: DC | PRN

## 2014-03-04 MED ORDER — ONDANSETRON HCL 4 MG/2ML IJ SOLN
4.0000 mg | Freq: Four times a day (QID) | INTRAMUSCULAR | Status: DC | PRN
  Filled 2014-03-04: qty 2

## 2014-03-04 MED ORDER — CITRIC ACID-SODIUM CITRATE 334-500 MG/5ML PO SOLN
30.0000 mL | ORAL | Status: DC | PRN
  Administered 2014-03-06: 30 mL via ORAL
  Filled 2014-03-04: qty 15

## 2014-03-04 MED ORDER — ACETAMINOPHEN 325 MG PO TABS: 650.0000 mg | ORAL_TABLET | ORAL | Status: DC | PRN

## 2014-03-04 MED ORDER — IBUPROFEN 600 MG PO TABS: 600.0000 mg | ORAL_TABLET | Freq: Four times a day (QID) | ORAL | Status: DC | PRN

## 2014-03-04 MED ORDER — CLINDAMYCIN PHOSPHATE 900 MG/50ML IV SOLN
900.0000 mg | Freq: Three times a day (TID) | INTRAVENOUS | Status: DC
  Administered 2014-03-04 – 2014-03-05 (×4): 900 mg via INTRAVENOUS
  Filled 2014-03-04 (×6): qty 50

## 2014-03-04 MED ORDER — MISOPROSTOL 25 MCG QUARTER TABLET
25.0000 ug | ORAL_TABLET | ORAL | Status: DC | PRN
  Administered 2014-03-04 – 2014-03-05 (×2): 25 ug via VAGINAL
  Filled 2014-03-04 (×2): qty 0.25

## 2014-03-04 MED ORDER — OXYCODONE-ACETAMINOPHEN 5-325 MG PO TABS: 1.0000 | ORAL_TABLET | ORAL | Status: DC | PRN

## 2014-03-05 ENCOUNTER — Inpatient Hospital Stay (HOSPITAL_COMMUNITY): Payer: BC Managed Care – PPO | Admitting: Anesthesiology

## 2014-03-05 ENCOUNTER — Encounter (HOSPITAL_COMMUNITY): Payer: BC Managed Care – PPO | Admitting: Anesthesiology

## 2014-03-05 LAB — RPR

## 2014-03-05 MED ORDER — FENTANYL 2.5 MCG/ML BUPIVACAINE 1/10 % EPIDURAL INFUSION (WH - ANES)
14.0000 mL/h | INTRAMUSCULAR | Status: DC | PRN
  Administered 2014-03-05: 14 mL/h via EPIDURAL
  Administered 2014-03-05: 12 mL/h via EPIDURAL
  Filled 2014-03-05 (×2): qty 125

## 2014-03-05 MED ORDER — LIDOCAINE HCL (PF) 1 % IJ SOLN
INTRAMUSCULAR | Status: DC | PRN
  Administered 2014-03-05: 10 mL

## 2014-03-05 MED ORDER — LACTATED RINGERS IV SOLN
500.0000 mL | Freq: Once | INTRAVENOUS | Status: AC
  Administered 2014-03-05: 500 mL via INTRAVENOUS

## 2014-03-05 MED ORDER — DIPHENHYDRAMINE HCL 50 MG/ML IJ SOLN: 12.5000 mg | INTRAMUSCULAR | Status: DC | PRN

## 2014-03-05 MED ORDER — PHENYLEPHRINE 40 MCG/ML (10ML) SYRINGE FOR IV PUSH (FOR BLOOD PRESSURE SUPPORT): 80.0000 ug | PREFILLED_SYRINGE | INTRAVENOUS | Status: DC | PRN

## 2014-03-05 MED ORDER — VALACYCLOVIR HCL 500 MG PO TABS
500.0000 mg | ORAL_TABLET | Freq: Two times a day (BID) | ORAL | Status: DC
  Administered 2014-03-05 (×2): 500 mg via ORAL
  Filled 2014-03-05 (×3): qty 1

## 2014-03-05 MED ORDER — PHENYLEPHRINE 40 MCG/ML (10ML) SYRINGE FOR IV PUSH (FOR BLOOD PRESSURE SUPPORT)
80.0000 ug | PREFILLED_SYRINGE | INTRAVENOUS | Status: DC | PRN
  Filled 2014-03-05: qty 10

## 2014-03-05 MED ORDER — OXYTOCIN 40 UNITS IN LACTATED RINGERS INFUSION - SIMPLE MED
1.0000 m[IU]/min | INTRAVENOUS | Status: DC
  Administered 2014-03-05: 14 m[IU]/min via INTRAVENOUS
  Administered 2014-03-05: 12 m[IU]/min via INTRAVENOUS
  Administered 2014-03-05: 2 m[IU]/min via INTRAVENOUS
  Administered 2014-03-05: 10 m[IU]/min via INTRAVENOUS
  Administered 2014-03-05: 8 m[IU]/min via INTRAVENOUS
  Administered 2014-03-05: 6 m[IU]/min via INTRAVENOUS
  Administered 2014-03-05: 4 m[IU]/min via INTRAVENOUS
  Filled 2014-03-05: qty 1000

## 2014-03-05 MED ORDER — EPHEDRINE 5 MG/ML INJ: 10.0000 mg | INTRAVENOUS | Status: DC | PRN

## 2014-03-05 MED ORDER — FENTANYL 2.5 MCG/ML BUPIVACAINE 1/10 % EPIDURAL INFUSION (WH - ANES): 12.0000 mL/h | INTRAMUSCULAR | Status: DC | PRN

## 2014-03-05 NOTE — Progress Notes (Signed)
Comfortable Afeb, VSS FHT- Cat I, ctx q 2-3 min VE-3/70/-1, vtx Continue pitocin and Clinda

## 2014-03-05 NOTE — H&P (Signed)
Katherine Kidd is a 38 y.o. female, G2 P1001, EGA 39+ weeks with EDC 8-25 presenting for cervical ripening and induction due to SGA and possible fetal anomaly.  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  MFM following, recently doing biweekly AFI and weekly UA dopplers in the office along with NSTs, all looks good.  Cervix recently borderline favorable, pt requests ripening and induction, and I discussed this with Dr. Otho Kidd and he agreed that ripening and induction was acceptable.  Prenatal care also complicated by h/o HSV, no symptoms or lesions, on Valtrex for the past month for suppression.  See prenatal records for complete history.  Maternal Medical History:  Fetal activity: Perceived fetal activity is normal.      OB History   Grav Para Term Preterm Abortions TAB SAB Ect Mult Living   2 0   1  1   0     Past Medical History  Diagnosis Date  . HSV-2 infection    Past Surgical History  Procedure Laterality Date  . Tonsillectomy    . Septo rhinoplasty    . Lamienectomy      L5-S1   Family History: family history includes Breast cancer in her paternal aunt; Breast cancer (age of onset: 43) in her maternal aunt; Breast cancer (age of onset: 51) in her mother; Cancer in her father and maternal aunt; Diabetes in her paternal grandfather and paternal grandmother; Heart disease in her maternal grandfather and maternal uncle; Hypertension in her brother and mother. Social History:  reports that she has been smoking.  She has never used smokeless tobacco. She reports that she drinks about one ounce of alcohol per week. She reports that she does not use illicit drugs.   Prenatal Transfer Tool  Maternal Diabetes: No Genetic Screening: Normal Maternal Ultrasounds/Referrals: Abnormal:  Findings:   Other: Fetal Ultrasounds or other Referrals:  Referred to Materal Fetal Medicine  Maternal Substance Abuse:   No Significant Maternal Medications:  Meds include: Other:  Significant Maternal Lab Results:  Lab values include: Group B Strep positive Other Comments:  early cystic hygroma, MFM followed for micrognathia, bilateral club feet, Panorama low risk female, on Valtrex suppression for HSV  Review of Systems  Respiratory: Negative.   Cardiovascular: Negative.     Dilation: 1.5 Effacement (%): 50 Station: -2 Exam by:: Dr. Jackelyn Kidd Blood pressure 103/67, pulse 79, temperature 98.2 F (36.8 C), temperature source Oral, resp. rate 20, height  (1.473 m), weight 50.349 kg (111 lb), last menstrual period 05/30/2013. Maternal Exam:  Uterine Assessment: Contraction strength is mild.  Contraction frequency is irregular.   Abdomen: Patient reports no abdominal tenderness. Estimated fetal weight is 6 lbs.   Fetal presentation: vertex  Introitus: Normal vulva. Normal vagina.  Amniotic fluid character: not assessed.  Pelvis: adequate for delivery.   Cervix: Cervix evaluated by digital exam.     Fetal Exam Fetal Monitor Review: Mode: ultrasound.   Baseline rate: 140.  Variability: moderate (6-25 bpm).   Pattern: accelerations present and no decelerations.    Fetal State Assessment: Category I - tracings are normal.     Physical Exam  Vitals reviewed. Constitutional: She appears well-developed and well-nourished.  Cardiovascular: Normal rate, regular rhythm and normal heart sounds.   No murmur heard. Respiratory: Effort normal and breath sounds normal. No respiratory distress. She has no wheezes.  GI: Soft.    Prenatal labs: ABO, Rh: O/Positive/-- (02/04  0000) Antibody: Negative (02/04 0000) Rubella: Immune (02/04 0000) RPR: NON REAC (08/23 2225)  HBsAg: Negative (02/04 0000)  HIV: Non-reactive (02/04 0000)  GBS: Positive (07/24 0000)  GCT:  129  Assessment/Plan: IUP at 39+ weeks with SGA fetus getting biweekly antenatal testing with borderline favorable cervix.  MFM  agrees with attempt at cervical ripening and induction.  Received 2 doses of cytotec overnight, nurse exam said she changed form 1 to 2-3 cm, but she is only 1-2 cm by my exam.  Will try pitocin and possible AROM when more dilated.  Continue clindamycin for +GBS.     Katherine Kidd D 03/05/2014, 8:21 AM

## 2014-03-05 NOTE — Anesthesia Preprocedure Evaluation (Signed)

## 2014-03-05 NOTE — Anesthesia Procedure Notes (Signed)
Epidural Patient location during procedure: OB  Preanesthetic Checklist Completed: patient identified, site marked, surgical consent, pre-op evaluation, timeout performed, IV checked, risks and benefits discussed and monitors and equipment checked  Epidural Patient position: sitting Prep: site prepped and draped and DuraPrep Patient monitoring: continuous pulse ox and blood pressure Approach: midline Injection technique: LOR air  Needle:  Needle type: Tuohy  Needle gauge: 17 G Needle length: 9 cm and 9 Needle insertion depth: 4 cm Catheter type: closed end flexible Catheter size: 19 Gauge Catheter at skin depth: 9 cm Test dose: negative  Assessment Events: blood not aspirated, injection not painful, no injection resistance, negative IV test and no paresthesia  Additional Notes Dosing of Epidural:  1st dose, through catheter .............................................  Xylocaine 40 mg  2nd dose, through catheter, after waiting 3 minutes.........Xylocaine 60 mg    ( 1% Xylo charted as a single dose in Epic Meds for ease of charting; actual dosing was fractionated as above, for saftey's sake)  As each dose occurred, patient was free of IV sx; and patient exhibited no evidence of SA injection.  Patient is more comfortable after epidural dosed. Please see RN's note for documentation of vital signs,and FHR which are stable.  Patient reminded not to try to ambulate with numb legs, and that an RN must be present when she attempts to get up.       

## 2014-03-05 NOTE — Progress Notes (Signed)
Comfortable Afeb, VSS FHT- Cat I, ctx q 2-3 min VE-4/80/-1, vtx Continue pitocin and Clinda, monitor progress, still in latent labor

## 2014-03-05 NOTE — Progress Notes (Signed)
Comfortable with epidural Afeb, VSS FHT- Cat I, ctx q 2-3 min VE-2/60/-2, vtx, AROM clear Continue pitocin and monitor progress, continue Clinda for +GBS

## 2014-03-06 ENCOUNTER — Encounter (HOSPITAL_COMMUNITY)
Admission: AD | Disposition: A | Discharge status: Home / Self Care | Payer: Self-pay | Source: Ambulatory Visit | Attending: Obstetrics and Gynecology

## 2014-03-06 ENCOUNTER — Encounter (HOSPITAL_COMMUNITY): Payer: Self-pay | Admitting: Anesthesiology

## 2014-03-06 LAB — ABO/RH: ABO/RH(D): O POS

## 2014-03-06 SURGERY — Surgical Case
Anesthesia: Epidural | Site: Abdomen

## 2014-03-06 MED ORDER — KETOROLAC TROMETHAMINE 30 MG/ML IJ SOLN
INTRAMUSCULAR | Status: AC
  Filled 2014-03-06: qty 1

## 2014-03-06 MED ORDER — MENTHOL 3 MG MT LOZG: 1.0000 | LOZENGE | OROMUCOSAL | Status: DC | PRN

## 2014-03-06 MED ORDER — ONDANSETRON HCL 4 MG/2ML IJ SOLN
INTRAMUSCULAR | Status: AC
  Filled 2014-03-06: qty 2

## 2014-03-06 MED ORDER — ONDANSETRON HCL 4 MG PO TABS
4.0000 mg | ORAL_TABLET | ORAL | Status: DC | PRN
Start: 2014-03-06 — End: 2014-03-08

## 2014-03-06 MED ORDER — ONDANSETRON HCL 4 MG/2ML IJ SOLN
INTRAMUSCULAR | Status: DC | PRN
  Administered 2014-03-06: 4 mg via INTRAVENOUS

## 2014-03-06 MED ORDER — LANOLIN HYDROUS EX OINT
1.0000 | TOPICAL_OINTMENT | CUTANEOUS | Status: DC | PRN
Start: 2014-03-06 — End: 2014-03-08

## 2014-03-06 MED ORDER — LACTATED RINGERS IV SOLN
INTRAVENOUS | Status: DC
  Administered 2014-03-06: 16:00:00 via INTRAVENOUS

## 2014-03-06 MED ORDER — ONDANSETRON HCL 4 MG/2ML IJ SOLN
4.0000 mg | Freq: Three times a day (TID) | INTRAMUSCULAR | Status: DC | PRN
Start: 2014-03-06 — End: 2014-03-08

## 2014-03-06 MED ORDER — BUPIVACAINE IN DEXTROSE 0.75-8.25 % IT SOLN
INTRATHECAL | Status: AC
  Filled 2014-03-06: qty 2

## 2014-03-06 MED ORDER — MAGNESIUM HYDROXIDE 400 MG/5ML PO SUSP: 30.0000 mL | ORAL | Status: DC | PRN

## 2014-03-06 MED ORDER — WITCH HAZEL-GLYCERIN EX PADS: 1.0000 "application " | MEDICATED_PAD | CUTANEOUS | Status: DC | PRN

## 2014-03-06 MED ORDER — MEPERIDINE HCL 25 MG/ML IJ SOLN: 6.2500 mg | INTRAMUSCULAR | Status: DC | PRN

## 2014-03-06 MED ORDER — SENNOSIDES-DOCUSATE SODIUM 8.6-50 MG PO TABS
2.0000 | ORAL_TABLET | ORAL | Status: DC
Start: 2014-03-07 — End: 2014-03-08
  Administered 2014-03-08: 2 via ORAL
  Filled 2014-03-06 (×2): qty 2

## 2014-03-06 MED ORDER — ZOLPIDEM TARTRATE 5 MG PO TABS: 5.0000 mg | ORAL_TABLET | Freq: Every evening | ORAL | Status: DC | PRN

## 2014-03-06 MED ORDER — LIDOCAINE-EPINEPHRINE (PF) 2 %-1:200000 IJ SOLN
INTRAMUSCULAR | Status: AC
  Filled 2014-03-06: qty 20

## 2014-03-06 MED ORDER — SODIUM BICARBONATE 8.4 % IV SOLN
INTRAVENOUS | Status: DC | PRN
  Administered 2014-03-06: 3 mL via EPIDURAL
  Administered 2014-03-06: 10 mL via EPIDURAL

## 2014-03-06 MED ORDER — DIBUCAINE 1 % RE OINT: 1.0000 "application " | TOPICAL_OINTMENT | RECTAL | Status: DC | PRN

## 2014-03-06 MED ORDER — PROMETHAZINE HCL 25 MG/ML IJ SOLN: 6.2500 mg | INTRAMUSCULAR | Status: DC | PRN

## 2014-03-06 MED ORDER — SIMETHICONE 80 MG PO CHEW
80.0000 mg | CHEWABLE_TABLET | ORAL | Status: DC | PRN
  Administered 2014-03-06 – 2014-03-07 (×3): 80 mg via ORAL
  Filled 2014-03-06 (×3): qty 1

## 2014-03-06 MED ORDER — IBUPROFEN 600 MG PO TABS
600.0000 mg | ORAL_TABLET | Freq: Four times a day (QID) | ORAL | Status: DC
  Administered 2014-03-06 – 2014-03-08 (×7): 600 mg via ORAL
  Filled 2014-03-06 (×9): qty 1

## 2014-03-06 MED ORDER — MORPHINE SULFATE (PF) 0.5 MG/ML IJ SOLN
INTRAMUSCULAR | Status: DC | PRN
  Administered 2014-03-06: 300 ug via INTRAVENOUS
  Administered 2014-03-06: 200 ug via INTRAVENOUS

## 2014-03-06 MED ORDER — OXYTOCIN 10 UNIT/ML IJ SOLN
40.0000 [IU] | INTRAVENOUS | Status: DC | PRN
  Administered 2014-03-06: 40 [IU] via INTRAVENOUS

## 2014-03-06 MED ORDER — NALBUPHINE HCL 10 MG/ML IJ SOLN: 5.0000 mg | INTRAMUSCULAR | Status: DC | PRN

## 2014-03-06 MED ORDER — SCOPOLAMINE 1 MG/3DAYS TD PT72
1.0000 | MEDICATED_PATCH | Freq: Once | TRANSDERMAL | Status: DC
  Administered 2014-03-06: 1.5 mg via TRANSDERMAL

## 2014-03-06 MED ORDER — CEFAZOLIN SODIUM-DEXTROSE 2-3 GM-% IV SOLR
INTRAVENOUS | Status: DC | PRN
  Administered 2014-03-06: 2 g via INTRAVENOUS

## 2014-03-06 MED ORDER — HYDROMORPHONE HCL PF 1 MG/ML IJ SOLN: 0.2500 mg | INTRAMUSCULAR | Status: DC | PRN

## 2014-03-06 MED ORDER — SODIUM BICARBONATE 8.4 % IV SOLN
INTRAVENOUS | Status: AC
  Filled 2014-03-06: qty 50

## 2014-03-06 MED ORDER — TETANUS-DIPHTH-ACELL PERTUSSIS 5-2.5-18.5 LF-MCG/0.5 IM SUSP
0.5000 mL | Freq: Once | INTRAMUSCULAR | Status: DC
Start: 2014-03-07 — End: 2014-03-08

## 2014-03-06 MED ORDER — ONDANSETRON HCL 4 MG/2ML IJ SOLN
4.0000 mg | INTRAMUSCULAR | Status: DC | PRN
Start: 2014-03-06 — End: 2014-03-08

## 2014-03-06 MED ORDER — MEPERIDINE HCL 25 MG/ML IJ SOLN
INTRAMUSCULAR | Status: AC
Start: 2014-03-06 — End: 2014-03-06
  Filled 2014-03-06: qty 1

## 2014-03-06 MED ORDER — PHENYLEPHRINE HCL 10 MG/ML IJ SOLN
INTRAMUSCULAR | Status: DC | PRN
  Administered 2014-03-06: 80 ug via INTRAVENOUS

## 2014-03-06 MED ORDER — DIPHENHYDRAMINE HCL 50 MG/ML IJ SOLN: 12.5000 mg | INTRAMUSCULAR | Status: DC | PRN

## 2014-03-06 MED ORDER — IBUPROFEN 600 MG PO TABS
600.0000 mg | ORAL_TABLET | Freq: Four times a day (QID) | ORAL | Status: DC | PRN
  Administered 2014-03-06 – 2014-03-08 (×2): 600 mg via ORAL

## 2014-03-06 MED ORDER — METOCLOPRAMIDE HCL 5 MG/ML IJ SOLN: 10.0000 mg | Freq: Three times a day (TID) | INTRAMUSCULAR | Status: DC | PRN

## 2014-03-06 MED ORDER — NALOXONE HCL 1 MG/ML IJ SOLN: 1.0000 ug/kg/h | INTRAVENOUS | Status: DC | PRN

## 2014-03-06 MED ORDER — NALOXONE HCL 0.4 MG/ML IJ SOLN: 0.4000 mg | INTRAMUSCULAR | Status: DC | PRN

## 2014-03-06 MED ORDER — DIPHENHYDRAMINE HCL 25 MG PO CAPS: 25.0000 mg | ORAL_CAPSULE | Freq: Four times a day (QID) | ORAL | Status: DC | PRN

## 2014-03-06 MED ORDER — KETOROLAC TROMETHAMINE 30 MG/ML IJ SOLN: 15.0000 mg | Freq: Once | INTRAMUSCULAR | Status: DC | PRN

## 2014-03-06 MED ORDER — KETOROLAC TROMETHAMINE 30 MG/ML IJ SOLN
30.0000 mg | Freq: Four times a day (QID) | INTRAMUSCULAR | Status: AC | PRN
  Administered 2014-03-06: 30 mg via INTRAVENOUS

## 2014-03-06 MED ORDER — OXYTOCIN 40 UNITS IN LACTATED RINGERS INFUSION - SIMPLE MED: 62.5000 mL/h | INTRAVENOUS | Status: AC

## 2014-03-06 MED ORDER — PRENATAL MULTIVITAMIN CH
1.0000 | ORAL_TABLET | Freq: Every day | ORAL | Status: DC
  Administered 2014-03-06 – 2014-03-08 (×3): 1 via ORAL
  Filled 2014-03-06 (×2): qty 1
  Filled 2014-03-06: qty 2

## 2014-03-06 MED ORDER — OXYCODONE-ACETAMINOPHEN 5-325 MG PO TABS
1.0000 | ORAL_TABLET | ORAL | Status: DC | PRN
  Administered 2014-03-07 (×4): 1 via ORAL
  Administered 2014-03-07: 2 via ORAL
  Administered 2014-03-08: 1 via ORAL
  Filled 2014-03-06 (×4): qty 1
  Filled 2014-03-06: qty 2
  Filled 2014-03-06: qty 1

## 2014-03-06 MED ORDER — SODIUM CHLORIDE 0.9 % IJ SOLN: 3.0000 mL | INTRAMUSCULAR | Status: DC | PRN

## 2014-03-06 MED ORDER — SCOPOLAMINE 1 MG/3DAYS TD PT72
MEDICATED_PATCH | TRANSDERMAL | Status: AC
  Filled 2014-03-06: qty 1

## 2014-03-06 MED ORDER — KETOROLAC TROMETHAMINE 30 MG/ML IJ SOLN: 30.0000 mg | Freq: Four times a day (QID) | INTRAMUSCULAR | Status: AC | PRN

## 2014-03-06 MED ORDER — MEASLES, MUMPS & RUBELLA VAC ~~LOC~~ INJ
0.5000 mL | INJECTION | Freq: Once | SUBCUTANEOUS | Status: DC
Start: 2014-03-07 — End: 2014-03-08
  Filled 2014-03-06: qty 0.5

## 2014-03-06 MED ORDER — MEPERIDINE HCL 25 MG/ML IJ SOLN
INTRAMUSCULAR | Status: DC | PRN
  Administered 2014-03-06: 25 mg via INTRAVENOUS

## 2014-03-06 MED ORDER — DIPHENHYDRAMINE HCL 25 MG PO CAPS: 25.0000 mg | ORAL_CAPSULE | ORAL | Status: DC | PRN

## 2014-03-06 MED ORDER — OXYTOCIN 10 UNIT/ML IJ SOLN
INTRAMUSCULAR | Status: AC
  Filled 2014-03-06: qty 4

## 2014-03-06 MED ORDER — DIPHENHYDRAMINE HCL 50 MG/ML IJ SOLN: 25.0000 mg | INTRAMUSCULAR | Status: DC | PRN

## 2014-03-06 MED ORDER — NALBUPHINE HCL 10 MG/ML IJ SOLN
5.0000 mg | INTRAMUSCULAR | Status: DC | PRN
Start: 2014-03-06 — End: 2014-03-08

## 2014-03-06 MED ORDER — MORPHINE SULFATE 0.5 MG/ML IJ SOLN
INTRAMUSCULAR | Status: AC
  Filled 2014-03-06: qty 10

## 2014-03-06 SURGICAL SUPPLY — 37 items
BENZOIN TINCTURE PRP APPL 2/3 (GAUZE/BANDAGES/DRESSINGS) ×3 IMPLANT
BLADE SURG 10 STRL SS (BLADE) ×6 IMPLANT
CLAMP CORD UMBIL (MISCELLANEOUS) IMPLANT
CLOTH BEACON ORANGE TIMEOUT ST (SAFETY) ×3 IMPLANT
CONTAINER PREFILL 10% NBF 15ML (MISCELLANEOUS) IMPLANT
DRAPE LG THREE QUARTER DISP (DRAPES) IMPLANT
DRSG OPSITE POSTOP 4X10 (GAUZE/BANDAGES/DRESSINGS) ×3 IMPLANT
DURAPREP 26ML APPLICATOR (WOUND CARE) ×3 IMPLANT
ELECT REM PT RETURN 9FT ADLT (ELECTROSURGICAL) ×3
ELECTRODE REM PT RTRN 9FT ADLT (ELECTROSURGICAL) ×1 IMPLANT
EXTRACTOR VACUUM KIWI (MISCELLANEOUS) IMPLANT
EXTRACTOR VACUUM M CUP 4 TUBE (SUCTIONS) IMPLANT
EXTRACTOR VACUUM M CUP 4' TUBE (SUCTIONS)
GLOVE BIO SURGEON STRL SZ8 (GLOVE) ×3 IMPLANT
GLOVE ORTHO TXT STRL SZ7.5 (GLOVE) ×3 IMPLANT
GOWN STRL REUS W/TWL LRG LVL3 (GOWN DISPOSABLE) ×6 IMPLANT
KIT ABG SYR 3ML LUER SLIP (SYRINGE) IMPLANT
NEEDLE HYPO 25X5/8 SAFETYGLIDE (NEEDLE) ×3 IMPLANT
NEEDLE KEITH (NEEDLE) ×3 IMPLANT
NS IRRIG 1000ML POUR BTL (IV SOLUTION) ×3 IMPLANT
PACK C SECTION WH (CUSTOM PROCEDURE TRAY) ×3 IMPLANT
PAD OB MATERNITY 4.3X12.25 (PERSONAL CARE ITEMS) ×3 IMPLANT
RETRACTOR WND ALEXIS 25 LRG (MISCELLANEOUS) IMPLANT
RTRCTR C-SECT PINK 25CM LRG (MISCELLANEOUS) ×3 IMPLANT
RTRCTR WOUND ALEXIS 25CM LRG (MISCELLANEOUS)
STAPLER VISISTAT 35W (STAPLE) IMPLANT
SUT CHROMIC 1 CTX 36 (SUTURE) ×6 IMPLANT
SUT PLAIN 0 NONE (SUTURE) IMPLANT
SUT PLAIN 2 0 XLH (SUTURE) IMPLANT
SUT VIC AB 0 CT1 27 (SUTURE) ×4
SUT VIC AB 0 CT1 27XBRD ANBCTR (SUTURE) ×2 IMPLANT
SUT VIC AB 2-0 CT1 27 (SUTURE) ×2
SUT VIC AB 2-0 CT1 TAPERPNT 27 (SUTURE) ×1 IMPLANT
SUT VIC AB 4-0 KS 27 (SUTURE) IMPLANT
TOWEL OR 17X24 6PK STRL BLUE (TOWEL DISPOSABLE) ×3 IMPLANT
TRAY FOLEY CATH 14FR (SET/KITS/TRAYS/PACK) ×3 IMPLANT
WATER STERILE IRR 1000ML POUR (IV SOLUTION) ×3 IMPLANT

## 2014-03-06 NOTE — Anesthesia Postprocedure Evaluation (Signed)
Anesthesia Post Note  Patient: Katherine Kidd  Procedure(s) Performed: Procedure(s) (LRB): CESAREAN SECTION (N/A)  Anesthesia type: Epidural  Patient location: Mother/Baby  Post pain: Pain level controlled  Post assessment: Post-op Vital signs reviewed  Last Vitals:  Filed Vitals:   03/06/14 1109  BP: 104/56  Pulse: 86  Temp: 36.8 C  Resp: 16    Post vital signs: Reviewed  Level of consciousness:alert  Complications: No apparent anesthesia complications

## 2014-03-06 NOTE — Progress Notes (Signed)
Comfortable with epidural Afeb, VSS FHT- Cat II, has periods of repetitive late decels, initially just before midnight responded to position change and decreasing pitocin, then another brief period at about 0144, then again around 0300 VE-unchanged Due to periods of repetitive late decels, and the fact that Katherine Kidd is still most likely in latent labor remote from delivery, I discussed proceeding with c-section and Katherine Kidd agreed.  Discussed the procedure and risks.

## 2014-03-06 NOTE — Addendum Note (Signed)
Addendum created 03/06/14 1412 by Graciela Husbands, CRNA   Modules edited: Notes Section   Notes Section:  File: [id number]

## 2014-03-06 NOTE — Anesthesia Postprocedure Evaluation (Signed)
Anesthesia Post Note  Patient: Katherine Kidd  Procedure(s) Performed: Procedure(s) (LRB): CESAREAN SECTION (N/A)  Anesthesia type: Epidural  Patient location: PACU  Post pain: Pain level controlled  Post assessment: Post-op Vital signs reviewed  Last Vitals:  Filed Vitals:   03/06/14 0630  BP: 112/75  Pulse: 99  Temp: 37 C  Resp: 22    Post vital signs: Reviewed  Level of consciousness: awake  Complications: No apparent anesthesia complications

## 2014-03-06 NOTE — Transfer of Care (Signed)
Immediate Anesthesia Transfer of Care Note  Patient: Katherine Kidd  Procedure(s) Performed: Procedure(s): CESAREAN SECTION (N/A)  Patient Location: PACU  Anesthesia Type:Epidural  Level of Consciousness: awake, alert  and oriented  Airway & Oxygen Therapy: Patient Spontanous Breathing  Post-op Assessment: Report given to PACU RN and Post -op Vital signs reviewed and stable  Post vital signs: Reviewed and stable  Complications: No apparent anesthesia complications

## 2014-03-06 NOTE — Progress Notes (Signed)
POD #0 Doing well Afeb, VSS Fundus firm, dressing C/D/I Continue routine care

## 2014-03-06 NOTE — Op Note (Signed)
Preoperative diagnosis: Intrauterine pregnancy at 39 weeks, fetal intolerance of labor Postoperative diagnosis: Same Procedure: Primary low transverse cesarean section without extensions Surgeon: Lavina Hamman M.D. Anesthesia: Epidural  Findings: Patient had normal gravid anatomy and delivered a viable female infant with Apgars of 8 and 9 weight pending Estimated blood loss: 800 cc Specimens: Placenta sent to labor and delivery Complications: None  Procedure in detail: The patient was taken to the operating room and placed in the dorsalsupine position. Her previously placed epidural was dosed appropriately.   Abdomen was then prepped and draped in the usual sterile fashion, a foley catheter had previously been inserted. The level of her anesthesia was found to be adequate. Abdomen was entered via a standard Pfannenstiel incision. Once the peritoneal cavity was entered the Alexis disposable self-retaining retractor was placed and good visualization was achieved. A 4 cm transverse incision was then made in the lower uterine segment pushing the bladder inferior. Once the uterine cavity was entered the incision was extended digitally. The fetal vertex was grasped and delivered through the incision atraumatically. Mouth and nares were suctioned. The remainder of the infant then delivered atraumatically. Cord was doubly clamped and cut and the infant handed to the awaiting pediatric team. Cord blood was obtained. The placenta delivered spontaneously. Uterus was wiped dry with clean lap pad and all clots and debris were removed. Uterine incision was inspected and found to be free of extensions. Uterine incision was closed in 2 layers with running locking #1 Chromic for the first layer, an imbricating layer for the second layer. Tubes and ovaries were inspected and found to be normal. Uterine incision was inspected and found to be hemostatic. Bleeding from serosal edges was controlled with electrocautery. The  Alexis retractor was removed. Subfascial space was irrigated and made hemostatic with electrocautery. Peritoneum was closed with running 2-0 Vicryl.  Fascia was closed in running fashion starting at both ends and meeting in the middle with 0 Vicryl. Subcutaneous tissue was then irrigated and made hemostatic with electrocautery.  Skin was closed with running 4-0 Vicryl in a subcuticular stitch followed by steri-strips and a sterile dressing. Patient tolerated the procedure well and was taken to the recovery in stable condition. Counts were correct x2, she received Ancef 2 g IV at the beginning of the procedure and she had PAS hose on throughout the procedure.

## 2014-03-07 ENCOUNTER — Encounter (HOSPITAL_COMMUNITY): Payer: Self-pay | Admitting: Obstetrics and Gynecology

## 2014-03-07 LAB — CBC
HCT: 27.7 % — ABNORMAL LOW (ref 36.0–46.0)
HEMOGLOBIN: 9.7 g/dL — AB (ref 12.0–15.0)
MCH: 32.3 pg (ref 26.0–34.0)
MCHC: 35 g/dL (ref 30.0–36.0)
MCV: 92.3 fL (ref 78.0–100.0)
PLATELETS: 219 10*3/uL (ref 150–400)
RBC: 3 MIL/uL — AB (ref 3.87–5.11)
RDW: 14.2 % (ref 11.5–15.5)
WBC: 17.3 10*3/uL — ABNORMAL HIGH (ref 4.0–10.5)

## 2014-03-07 NOTE — Progress Notes (Signed)
Subjective: Postpartum Day #1: Cesarean Delivery Patient reports incisional pain, tolerating PO and no problems voiding.    Objective: Vital signs in last 24 hours: Temp:  [98.2 F (36.8 C)-99.1 F (37.3 C)] 98.7 F (37.1 C) (08/26 0500) Pulse Rate:  [76-87] 76 (08/26 0500) Resp:  [16-20] 18 (08/26 0500) BP: (93-108)/(56-69) 100/64 mmHg (08/26 0500) SpO2:  [97 %-100 %] 98 % (08/26 0500)  Physical Exam:  General: alert Lochia: appropriate Uterine Fundus: firm Incision: dressing C/D/I   Recent Labs  03/04/14 2225 03/07/14 0610  HGB 12.2 9.7*  HCT 34.6* 27.7*    Assessment/Plan: Status post Cesarean section. Doing well postoperatively.  Continue current care, ambulate.  Tai Skelly D 03/07/2014, 8:35 AM

## 2014-03-08 MED ORDER — IBUPROFEN 600 MG PO TABS: 600.0000 mg | ORAL_TABLET | Freq: Four times a day (QID) | ORAL | Status: AC | PRN

## 2014-03-08 MED ORDER — OXYCODONE-ACETAMINOPHEN 5-325 MG PO TABS: 1.0000 | ORAL_TABLET | ORAL | Status: DC | PRN

## 2014-03-08 NOTE — Discharge Summary (Signed)
Obstetric Discharge Summary Reason for Admission: induction of labor Prenatal Procedures: NST and ultrasound Intrapartum Procedures: cesarean: low cervical, transverse Postpartum Procedures: none Complications-Operative and Postpartum: none Hemoglobin  Date Value Ref Range Status  03/07/2014 9.7* 12.0 - 15.0 g/dL Final     HCT  Date Value Ref Range Status  03/07/2014 27.7* 36.0 - 46.0 % Final    Physical Exam:  General: alert Lochia: appropriate Uterine Fundus: firm Incision: healing well  Discharge Diagnoses: Term Pregnancy-delivered and fetal intolerance of labor, bilateral club feet  Discharge Information: Date: 03/08/2014 Activity: pelvic rest and no strenuous activity Diet: routine Medications: Ibuprofen and Percocet Condition: stable Instructions: refer to practice specific booklet Discharge to: home Follow-up Information   Follow up with Jakarius Flamenco D, MD. Schedule an appointment as soon as possible for a visit in 2 weeks.   Specialty:  Obstetrics and Gynecology   Contact information:   [address] [place] [address] [phone]       Newborn Data: Live born female  Birth Weight: 6 lb 10.5 oz (3020 g) APGAR: 8, 9  Home with mother.  Steffen Hase D 03/08/2014, 8:04 AM

## 2014-03-08 NOTE — Progress Notes (Signed)
POD #2 Doing well, wants to go home Afeb, VSS Abd- soft, fundus firm, incision intact D/c home 

## 2014-03-08 NOTE — Discharge Instructions (Signed)
As per discharge pamphlet °

## 2014-05-14 ENCOUNTER — Encounter (HOSPITAL_COMMUNITY): Payer: Self-pay | Admitting: Obstetrics and Gynecology

## 2014-09-17 ENCOUNTER — Other Ambulatory Visit: Payer: Self-pay | Admitting: Gastroenterology

## 2014-09-17 DIAGNOSIS — R1011 Right upper quadrant pain: Secondary | ICD-10-CM

## 2014-10-12 ENCOUNTER — Ambulatory Visit (HOSPITAL_COMMUNITY)
Admission: RE | Admit: 2014-10-12 | Discharge: 2014-10-12 | Disposition: A | Discharge status: Home / Self Care | Payer: BLUE CROSS/BLUE SHIELD | Source: Ambulatory Visit | Attending: Gastroenterology | Admitting: Gastroenterology

## 2014-10-12 DIAGNOSIS — R1011 Right upper quadrant pain: Secondary | ICD-10-CM | POA: Diagnosis not present

## 2014-10-12 MED ORDER — TECHNETIUM TC 99M MEBROFENIN IV KIT
5.1000 | PACK | Freq: Once | INTRAVENOUS | Status: AC | PRN
Start: 2014-10-12 — End: 2014-10-12
  Administered 2014-10-12: 5 via INTRAVENOUS

## 2014-10-12 MED ORDER — SINCALIDE 5 MCG IJ SOLR
0.0200 ug/kg | Freq: Once | INTRAMUSCULAR | Status: AC
  Administered 2014-10-12: 0.8 ug via INTRAVENOUS

## 2014-11-06 ENCOUNTER — Other Ambulatory Visit: Payer: Self-pay | Admitting: Surgery

## 2014-11-06 ENCOUNTER — Ambulatory Visit: Payer: Self-pay | Admitting: Surgery

## 2014-11-06 DIAGNOSIS — R1011 Right upper quadrant pain: Secondary | ICD-10-CM

## 2014-11-20 ENCOUNTER — Other Ambulatory Visit: Payer: BLUE CROSS/BLUE SHIELD

## 2015-01-04 ENCOUNTER — Other Ambulatory Visit: Payer: BLUE CROSS/BLUE SHIELD

## 2015-04-10 IMAGING — US US ABDOMEN COMPLETE
1 series · 14 of 25 positions shown · non-contrast
Comparison: None.

CLINICAL DATA: Epigastric and right upper quadrant pain.

COMPLETE ABDOMINAL ULTRASOUND

[Series 1: us abdomen complete · 0.19mm/px · 14 of 73 slices shown]
[im 1/73]
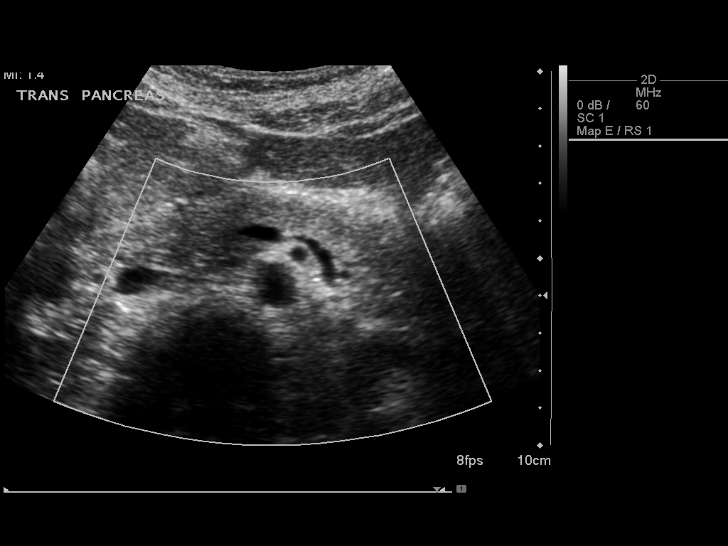
[im 7/73]
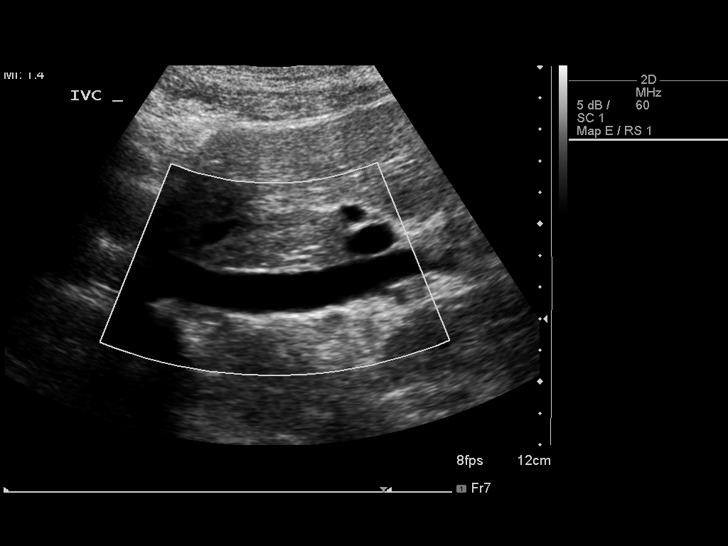
[im 13/73]
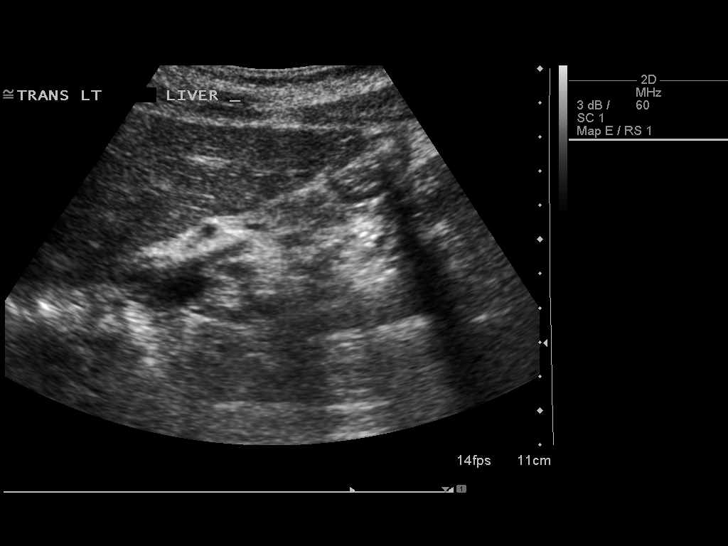
[im 19/73]
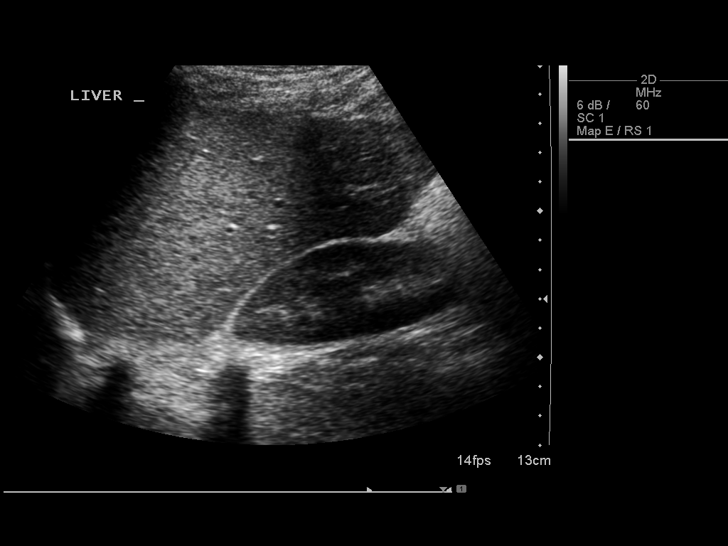
[im 25/73]
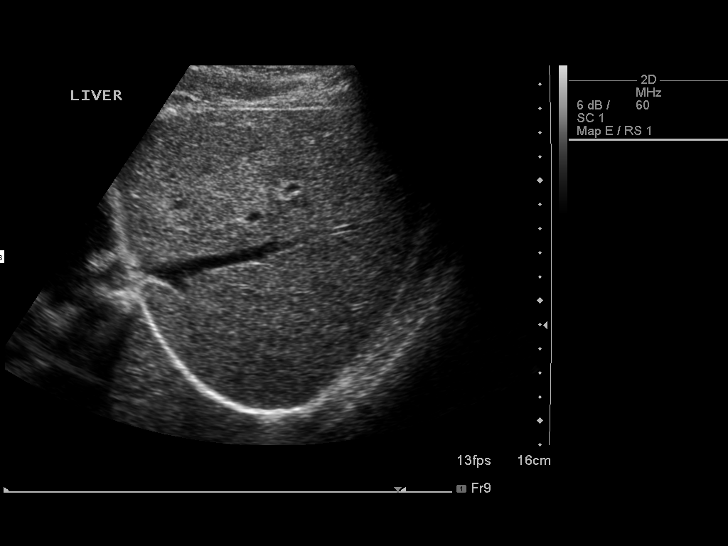
[im 28/73]
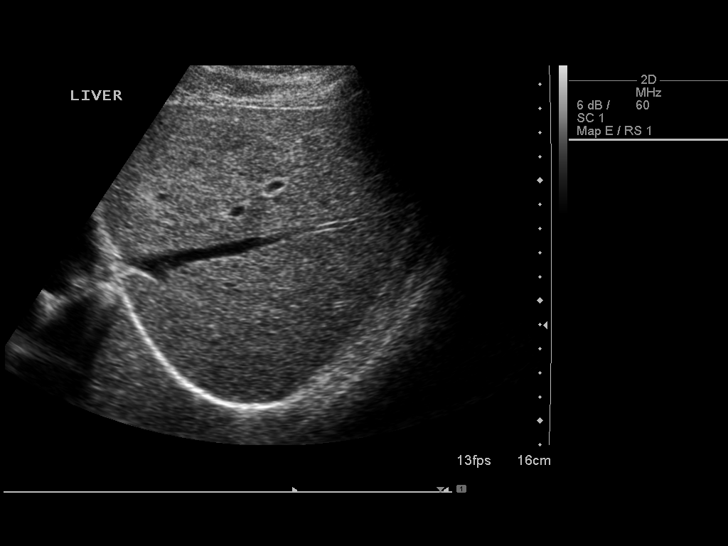
[im 34/73]
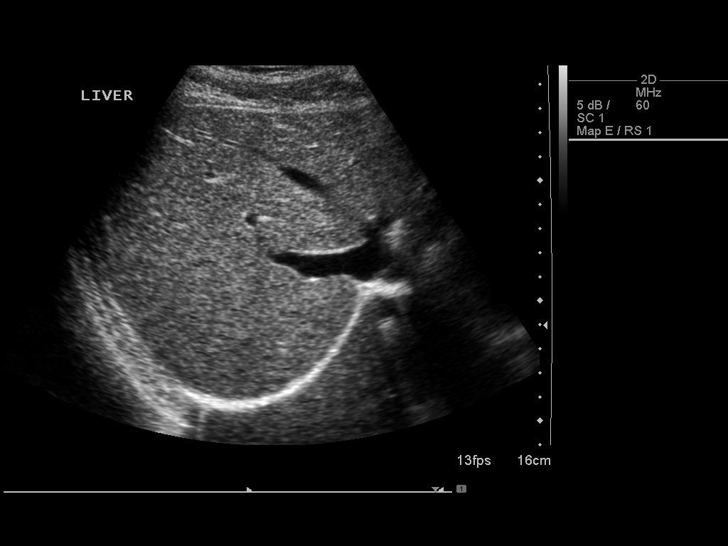
[im 40/73]
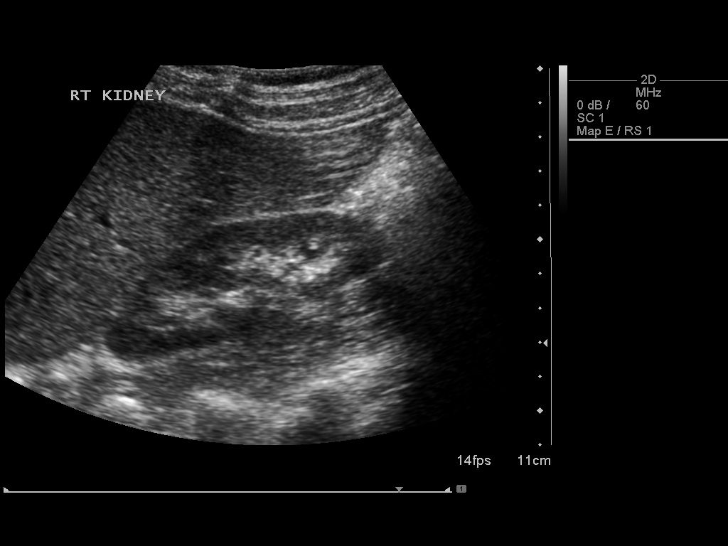
[im 46/73]
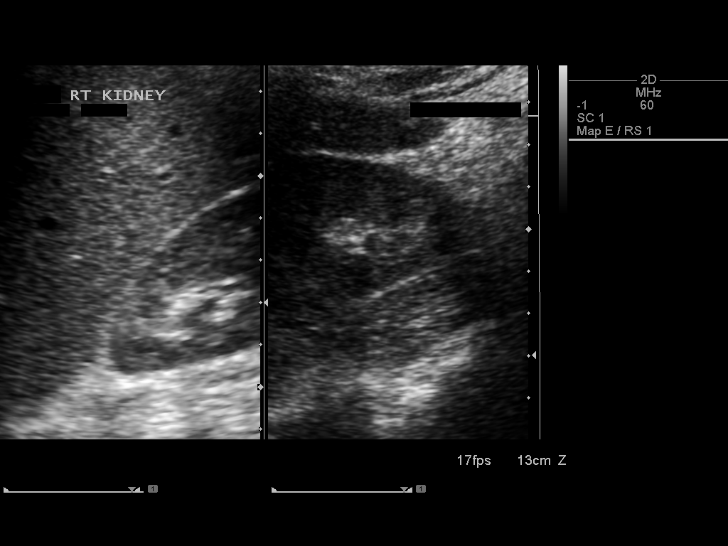
[im 49/73]
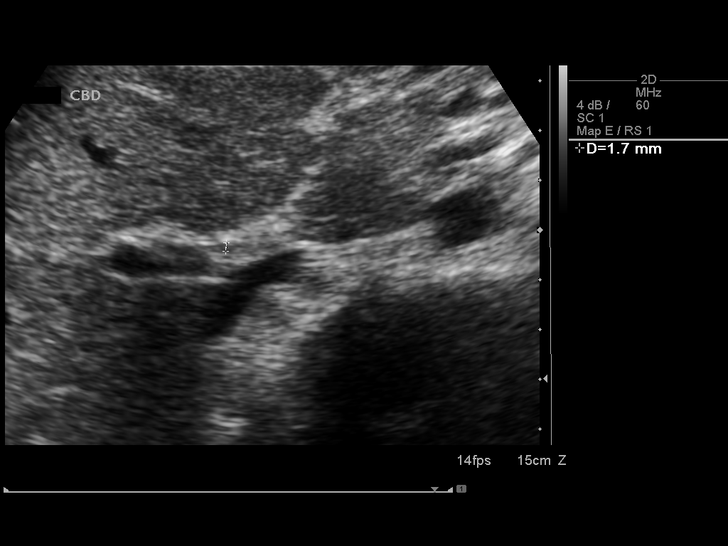
[im 55/73]
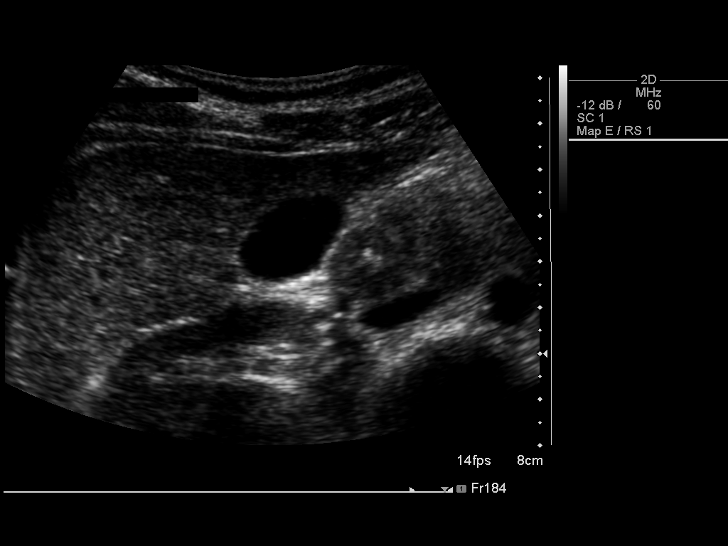
[im 61/73]
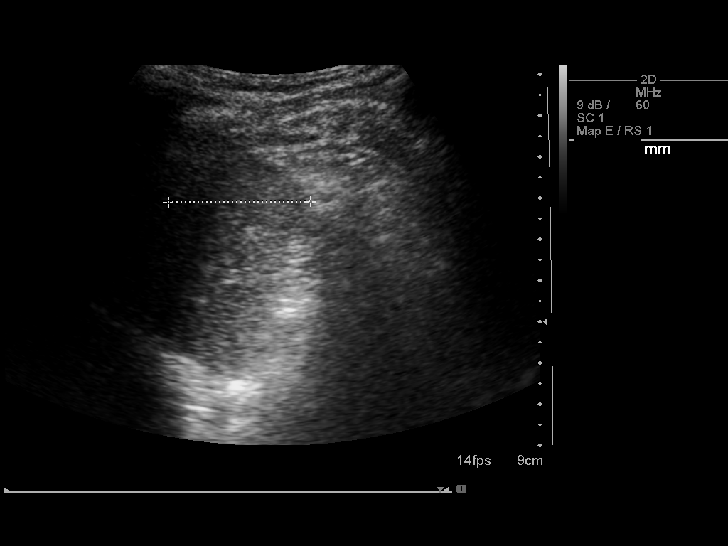
[im 67/73]
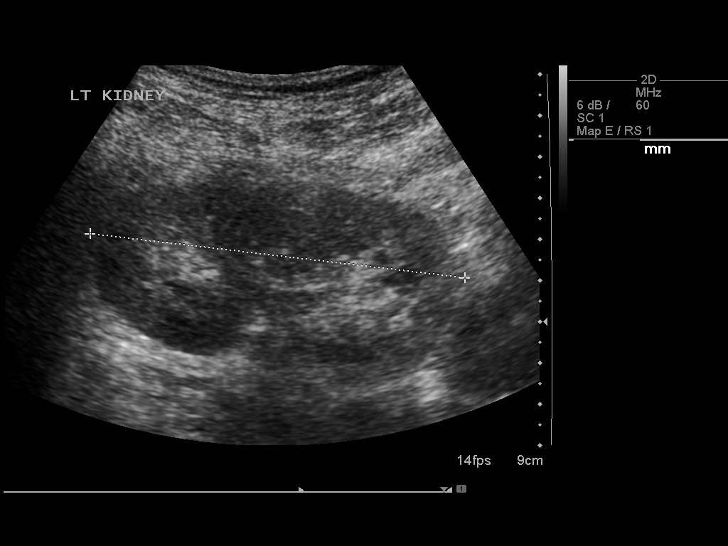
[im 73/73]
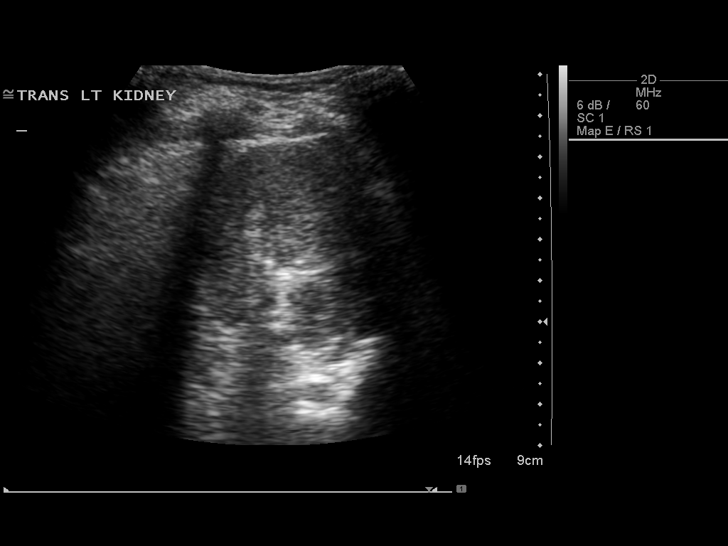

[14 of 25 positions shown; findings below may reference images not displayed]

FINDINGS: Gallbladder:  No gallstones, gallbladder wall thickening, or
pericholecystic fluid.

Common bile duct:   Within normal limits in caliber.

Liver:  No focal lesion identified.  Within normal limits in
parenchymal echogenicity.

IVC:  Appears normal.

Pancreas:  No focal abnormality seen.

Spleen:  Within normal limits in size and echotexture.

Right Kidney:   Normal in size and parenchymal echogenicity.  No
evidence of mass or hydronephrosis.

Left Kidney:  Normal in size and parenchymal echogenicity.  No
evidence of mass or hydronephrosis.

Abdominal aorta:  No aneurysm identified.
IMPRESSION: Negative abdominal ultrasound.

## 2016-05-08 ENCOUNTER — Encounter: Payer: Self-pay | Admitting: Family Medicine

## 2016-05-08 ENCOUNTER — Ambulatory Visit (INDEPENDENT_AMBULATORY_CARE_PROVIDER_SITE_OTHER): Payer: BLUE CROSS/BLUE SHIELD | Admitting: Family Medicine

## 2016-05-08 VITALS — BP 116/79 | HR 76 | Ht 58.5 in | Wt 84.9 lb

## 2016-05-08 DIAGNOSIS — Z139 Encounter for screening, unspecified: Secondary | ICD-10-CM

## 2016-05-08 DIAGNOSIS — Z720 Tobacco use: Secondary | ICD-10-CM

## 2016-05-08 DIAGNOSIS — Z716 Tobacco abuse counseling: Secondary | ICD-10-CM | POA: Insufficient documentation

## 2016-05-08 DIAGNOSIS — F17200 Nicotine dependence, unspecified, uncomplicated: Secondary | ICD-10-CM | POA: Insufficient documentation

## 2016-05-08 DIAGNOSIS — G8929 Other chronic pain: Secondary | ICD-10-CM | POA: Insufficient documentation

## 2016-05-08 DIAGNOSIS — R1011 Right upper quadrant pain: Secondary | ICD-10-CM

## 2016-05-08 DIAGNOSIS — Z681 Body mass index (BMI) 19 or less, adult: Secondary | ICD-10-CM

## 2016-05-08 DIAGNOSIS — R197 Diarrhea, unspecified: Secondary | ICD-10-CM | POA: Insufficient documentation

## 2016-05-08 DIAGNOSIS — K829 Disease of gallbladder, unspecified: Secondary | ICD-10-CM | POA: Diagnosis not present

## 2016-05-08 DIAGNOSIS — B009 Herpesviral infection, unspecified: Secondary | ICD-10-CM | POA: Insufficient documentation

## 2016-05-08 LAB — CBC WITH DIFFERENTIAL/PLATELET
Basophils Absolute: 0 cells/uL (ref 0–200)
Basophils Relative: 0 %
Eosinophils Absolute: 114 cells/uL (ref 15–500)
Eosinophils Relative: 1 %
HCT: 42.2 % (ref 35.0–45.0)
Hemoglobin: 14.2 g/dL (ref 11.7–15.5)
Lymphocytes Relative: 26 %
Lymphs Abs: 2964 cells/uL (ref 850–3900)
MCH: 32 pg (ref 27.0–33.0)
MCHC: 33.6 g/dL (ref 32.0–36.0)
MCV: 95 fL (ref 80.0–100.0)
MPV: 9.1 fL (ref 7.5–12.5)
Monocytes Absolute: 1026 cells/uL — ABNORMAL HIGH (ref 200–950)
Monocytes Relative: 9 %
Neutro Abs: 7296 cells/uL (ref 1500–7800)
Neutrophils Relative %: 64 %
Platelets: 371 10*3/uL (ref 140–400)
RBC: 4.44 MIL/uL (ref 3.80–5.10)
RDW: 13 % (ref 11.0–15.0)
WBC: 11.4 10*3/uL — ABNORMAL HIGH (ref 3.8–10.8)

## 2016-05-08 NOTE — Patient Instructions (Addendum)
Please call your general surgeon or your gastroenterologist if you are having constant abdominal pain and diarrhea and intolerance to fatty foods.    Cholecystitis Cholecystitis is inflammation of the gallbladder. It is often called a gallbladder attack. The gallbladder is a pear-shaped organ that lies beneath the liver on the right side of the body. The gallbladder stores bile, which is a fluid that helps the body to digest fats. If bile builds up in your gallbladder, your gallbladder becomes inflamed. This condition may occur suddenly (be acute). Repeat episodes of acute cholecystitis or prolonged episodes may lead to a long-term (chronic) condition. Cholecystitis is serious and it requires treatment.  CAUSES The most common cause of this condition is gallstones. Gallstones can block the tube (duct) that carries bile out of your gallbladder. This causes bile to build up. Other causes of this condition include:  Damage to the gallbladder due to a decrease in blood flow.  Infections in the bile ducts.  Scars or kinks in the bile ducts.  Tumors in the liver, pancreas, or gallbladder. RISK FACTORS This condition is more likely to develop in:  People who have sickle cell disease.  People who take birth control pills or use estrogen.  People who have alcoholic liver disease.  People who have liver cirrhosis.  People who have their nutrition delivered through a vein (parenteral nutrition).  People who do not eat or drink (do fasting) for a long period of time.  People who are obese.  People who have rapid weight loss.  People who are pregnant.  People who have increased triglyceride levels.  People who have pancreatitis. SYMPTOMS Symptoms of this condition include:  Abdominal pain, especially in the upper right area of the abdomen.  Abdominal tenderness or bloating.  Nausea.  Vomiting.  Fever.  Chills.  Yellowing of the skin and the whites of the eyes  (jaundice). DIAGNOSIS This condition is diagnosed with a medical history and physical exam. You may also have other tests, including:  Imaging tests, such as:  An ultrasound of the gallbladder.  A CT scan of the abdomen.  A gallbladder nuclear scan (HIDA scan). This scan allows your health care provider to see the bile moving from your liver to your gallbladder and to your small intestine.  MRI.  Blood tests, such as:  A complete blood count, because the white blood cell count may be higher than normal.  Liver function tests, because some levels may be higher than normal with certain types of gallstones. TREATMENT Treatment may include:  Fasting for a certain amount of time.  IV fluids.  Medicine to treat pain or vomiting.  Antibiotic medicine.  Surgery to remove your gallbladder (cholecystectomy). This may happen immediately or at a later time. HOME CARE INSTRUCTIONS Home care will depend on your treatment. In general:  Take over-the-counter and prescription medicines only as told by your health care provider.  If you were prescribed an antibiotic medicine, take it as told by your health care provider. Do not stop taking the antibiotic even if you start to feel better.  Follow instructions from your health care provider about what to eat or drink. When you are allowed to eat, avoid eating or drinking anything that triggers your symptoms.  Keep all follow-up visits as told by your health care provider. This is important. SEEK MEDICAL CARE IF:  Your pain is not controlled with medicine.  You have a fever. SEEK IMMEDIATE MEDICAL CARE IF:  Your pain moves to another part  of your abdomen or to your back.  You continue to have symptoms or you develop new symptoms even with treatment.   This information is not intended to replace advice given to you by your health care provider. Make sure you discuss any questions you have with your health care provider.   Document  Released: 06/29/2005 Document Revised: 03/20/2015 Document Reviewed: 10/10/2014 Elsevier Interactive Patient Education 2016 Else[employer] Inc[employer]       Low-Fat Diet for Pancreatitis or Gallbladder Conditions A low-fat diet can be helpful if you have pancreatitis or a gallbladder condition. With these conditions, your pancreas and gallbladder have trouble digesting fats. A healthy eating plan with less fat will help rest your pancreas and gallbladder and reduce your symptoms. WHAT DO I NEED TO KNOW ABOUT THIS DIET?  Eat a low-fat diet.  Reduce your fat intake to less than 20-30% of your total daily calories. This is less than 50-60 g of fat per day.  Remember that you need some fat in your diet. Ask your dietician what your daily goal should be.  Choose nonfat and low-fat healthy foods. Look for the words "nonfat," "low fat," or "fat free."  As a guide, look on the label and choose foods with less than 3 g of fat per serving. Eat only one serving.  Avoid alcohol.  Do not smoke. If you need help quitting, talk with your health care provider.  Eat small frequent meals instead of three large heavy meals. WHAT FOODS CAN I EAT? Grains Include healthy grains and starches such as potatoes, wheat bread, fiber-rich cereal, and brown rice. Choose whole grain options whenever possible. In adults, whole grains should account for 45-65% of your daily calories.  Fruits and Vegetables Eat plenty of fruits and vegetables. Fresh fruits and vegetables add fiber to your diet. Meats and Other Protein Sources Eat lean meat such as chicken and pork. Trim any fat off of meat before cooking it. Eggs, fish, and beans are other sources of protein. In adults, these foods should account for 10-35% of your daily calories. Dairy Choose low-fat milk and dairy options. Dairy includes fat and protein, as well as calcium.  Fats and Oils Limit high-fat foods such as fried foods, sweets, baked goods, sugary drinks.   Other Creamy sauces and condiments, such as mayonnaise, can add extra fat. Think about whether or not you need to use them, or use smaller amounts or low fat options. WHAT FOODS ARE NOT RECOMMENDED?  High fat foods, such as:  [employer].  Ice cream.  [place] toast.  Sweet rolls.  Pizza.  Cheese bread.  Foods covered with batter, butter, creamy sauces, or cheese.  Fried foods.  Sugary drinks and desserts.  Foods that cause gas or bloating   This information is not intended to replace advice given to you by your health care provider. Make sure you discuss any questions you have with your health care provider.   Document Released: 07/04/2013 Document Reviewed: 07/04/2013 Elsevier Interactive Patient Education [employer]

## 2016-05-08 NOTE — Progress Notes (Signed)
New patient office visit note:   Impression and Recommendations:    1. Chronic Cholestasis symptoms    2. Diarrhea, unspecified type   3. Abdominal pain, chronic, right upper quadrant   4. Tobacco abuse   5. Tobacco abuse counseling   6. HSV-2 infection   7. BMI less than 19,adult   8. Encounter for medical screening examination    Chronic Cholestasis symptoms  Follow-up with your gastroenterologist or your general surgeon who has evaluated you in the past for your gallbladder.   Advised she seek specialist guidance since weight is under and she is unable to tolerate any fatty foods at all.  Tobacco abuse Not ready to quit- pre-contemplative phases.  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  Diarrhea Chronic symptoms secondary to meal ingestion.  Patient understands this is secondary to her gallbladder disease    Orders Placed This Encounter  Procedures  . Bilirubin,Direct/Indirect(Fractionated)  . CBC with Differential/Platelet  . COMPLETE METABOLIC PANEL WITH GFR  . Gamma GT  . Lipase  . Amylase  . Lipid Panel w/reflex Direct LDL  . T4, free  . TSH  . VITAMIN D 25 Hydroxy (Vit-D Deficiency, Fractures)    Discontinued Medications   OXYCODONE-ACETAMINOPHEN (PERCOCET/ROXICET) 5-325 MG PER TABLET    Take 1-2 tablets by mouth every 4 (four) hours as needed for severe pain (moderate - severe pain).   PRENATAL VIT-FE FUMARATE-FA (PRENATAL MULTIVITAMIN) TABS TABLET    Take 1 tablet by mouth daily at 12 noon.    Return in about 4 months (around 09/08/2016) for Follow-up of current medical issues.  The patient was counseled, risk factors were discussed, anticipatory guidance given.  Gross side effects, risk and benefits, and alternatives of medications discussed with patient.  Patient is aware that all medications have potential side effects and we are  unable to predict every side effect or drug-drug interaction that may occur.  Expresses verbal understanding and consents to current therapy plan and treatment regimen.  Please see AVS handed out to patient at the end of our visit for further patient instructions/ counseling done pertaining to today's office visit.    Note: This document was prepared using Dragon voice recognition software and may include unintentional dictation errors.  ----------------------------------------------------------------------------------------------------------------------    Subjective:    Chief Complaint  Patient presents with  . Establish Care    HPI: Katherine Kidd is a pleasant 40 y.o. female who presents to [place] at [facility] today to review their medical history with me and establish care.   I asked the patient to review their chronic problem list with me to ensure everything was updated and accurate.    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   Met husband 5 yrs ago. He has bad bi-polar and they have very unsteady relationship.     GB problems many yrs.  Diarhea  Has several bouts.   Had HIDA scan ( 4/16) , [place] (2014) --> Dr Carol Ada of GI and also pt saw Surgeon ( 5/16) as well.  Eats healthy, salads, no red meat, veggies, Can't eat any fats - even good fats.  Can't drink any etoh.- since mid-Sept.   Causes bad R UQ pain  Smoked 20 yrs- 1ppd.  Drinks- bud light 3-4 beers/ day about 5-6 days per week.  Last times she drank was yesterday-->    Patient Care Team    Relationship Specialty Notifications Start End  Mellody Dance, DO PCP - General Family Medicine  05/08/16     Wt Readings from Last 3 Encounters:  05/08/16 84 lb 14.4 oz (38.5 kg)  10/12/14 90 lb (40.8 kg)  03/04/14 111 lb (50.3 kg)   BP Readings from Last 3 Encounters:  05/08/16 116/79  03/08/14 113/75  02/16/14 136/70   Pulse Readings from Last 3 Encounters:    05/08/16 76  03/08/14 84  02/16/14 96   BMI Readings from Last 3 Encounters:  05/08/16 17.44 kg/m  10/12/14 18.81 kg/m  03/04/14 23.20 kg/m   No results found for: HGBA1C  Patient Active Problem List   Diagnosis Date Noted  . Chronic Cholestasis symptoms  05/08/2016  . Tobacco abuse 05/08/2016  . Tobacco abuse counseling 05/08/2016  . HSV-2 infection 05/08/2016  . BMI less than 19,adult 05/08/2016  . Diarrhea 05/08/2016  . Abdominal pain, chronic, right upper quadrant 05/08/2016     Past Medical History:  Diagnosis Date  . Gallbladder attack   . HSV-2 infection      Past Surgical History:  Procedure Laterality Date  . CESAREAN SECTION N/A 03/06/2014   Procedure: CESAREAN SECTION;  Surgeon: Cheri Fowler, MD;  Location: [facility];  Service: Obstetrics;  Laterality: N/A;  . lamienectomy     L5-S1  . Septo Rhinoplasty    . TONSILLECTOMY       Family History  Problem Relation Age of Onset  . Breast cancer Mother 42    lump removed unsure if cancer  . Hyperlipidemia Mother   . Hypertension Mother   . Cancer Father     kidney- renal cell  . Hypertension Brother   . Breast cancer Maternal Aunt 35  . Breast cancer Paternal Aunt     Age unknown  . Diabetes Paternal Grandmother   . Diabetes Paternal Grandfather   . Cancer Maternal Aunt     colon  . Heart disease Maternal Uncle   . Heart disease Maternal Grandfather   . Diabetes Paternal Uncle      History  Drug Use No    History  Alcohol Use  . 1.0 oz/week  . 2 Standard drinks or equivalent per week    Comment: occassionally    History  Smoking Status  . Current Every Day Smoker  . Packs/day: 1.00  . Years: 20.00  Smokeless Tobacco  . Never Used    Patient's Medications  New Prescriptions   No medications on file  Previous Medications   DIPHENHYDRAMINE HCL (BENADRYL ALLERGY PO)    Take by mouth.   IBUPROFEN (ADVIL,MOTRIN) 600 MG TABLET    Take 1 tablet (600 mg total) by mouth every 6  (six) hours as needed for mild pain.   VALACYCLOVIR (VALTREX) 500 MG TABLET    Take 1 tablet by mouth daily.  Modified Medications   No medications on file  Discontinued Medications   OXYCODONE-ACETAMINOPHEN (PERCOCET/ROXICET) 5-325 MG PER TABLET    Take 1-2 tablets by mouth every 4 (four) hours as needed for severe pain (moderate - severe pain).   PRENATAL VIT-FE FUMARATE-FA (PRENATAL MULTIVITAMIN) TABS TABLET    Take 1 tablet by mouth daily at 12 noon.    Allergies: Amoxicillin  Review of Systems  Constitutional: Negative.  Negative for chills, diaphoresis, fever, malaise/fatigue and weight loss.  HENT: Negative.  Negative for congestion, sore throat and tinnitus.   Eyes:  Negative.  Negative for blurred vision, double vision and photophobia.  Respiratory: Negative.  Negative for cough and wheezing.   Cardiovascular: Negative.  Negative for chest pain and palpitations.  Gastrointestinal: Positive for abdominal pain and diarrhea. Negative for blood in stool, nausea and vomiting.  Genitourinary: Negative.  Negative for dysuria, frequency and urgency.  Musculoskeletal: Negative.  Negative for joint pain and myalgias.  Skin: Negative.  Negative for itching and rash.  Neurological: Negative.  Negative for dizziness, focal weakness, weakness and headaches.  Endo/Heme/Allergies: Negative.  Negative for environmental allergies and polydipsia. Does not bruise/bleed easily.  Psychiatric/Behavioral: Negative.  Negative for depression and memory loss. The patient is not nervous/anxious and does not have insomnia.     Objective:   Blood pressure 116/79, pulse 76, height 4' 10.5" (1.486 m), weight 84 lb 14.4 oz (38.5 kg), last menstrual period 04/20/2016, unknown if currently breastfeeding. Body mass index is 17.44 kg/m. General: Well Developed, well nourished, and in no acute distress.  Neuro: Alert and oriented x3, extra-ocular muscles intact, sensation grossly intact.  HEENT: Normocephalic,  atraumatic, pupils equal round reactive to light, neck supple Skin: no gross suspicious lesions or rashes  Cardiac: Regular rate and rhythm, no murmurs rubs or gallops.  Respiratory: Essentially clear to auscultation bilaterally. Not using accessory muscles, speaking in full sentences.  Abdominal: Soft, not grossly distended, no G/R/R, + BS * 4 Musculoskeletal: Ambulates w/o diff, FROM * 4 ext.  Vasc: less 2 sec cap RF, warm and pink  Psych:  No HI/SI, judgement and insight good, Euthymic mood. Full Affect.

## 2016-05-09 LAB — COMPLETE METABOLIC PANEL WITH GFR
ALBUMIN: 4.7 g/dL (ref 3.6–5.1)
ALK PHOS: 47 U/L (ref 33–115)
ALT: 23 U/L (ref 6–29)
AST: 16 U/L (ref 10–30)
BUN: 11 mg/dL (ref 7–25)
CALCIUM: 9.5 mg/dL (ref 8.6–10.2)
CO2: 22 mmol/L (ref 20–31)
CREATININE: 0.71 mg/dL (ref 0.50–1.10)
Chloride: 104 mmol/L (ref 98–110)
GFR, Est Non African American: >89 mL/min (ref >=60)
Glucose, Bld: 83 mg/dL (ref 65–99)
Potassium: 4.2 mmol/L (ref 3.5–5.3)
Sodium: 139 mmol/L (ref 135–146)
Total Bilirubin: 0.4 mg/dL (ref 0.2–1.2)
Total Protein: 7.1 g/dL (ref 6.1–8.1)

## 2016-05-09 LAB — LIPID PANEL W/REFLEX DIRECT LDL
Cholesterol: 165 mg/dL (ref 125–200)
HDL: 91 mg/dL (ref >=46)
LDL Cholesterol: 64 mg/dL (ref <130)
Total CHOL/HDL Ratio: 1.8 Ratio (ref <=5.0)
Triglycerides: 52 mg/dL (ref <150)

## 2016-05-09 LAB — T4, FREE: FREE T4: 1.3 ng/dL (ref 0.8–1.8)

## 2016-05-09 LAB — BILIRUBIN,DIRECT & INDIRECT (FRACTIONATED)
Bilirubin, Direct: 0.1 mg/dL (ref <=0.2)
Indirect Bilirubin: 0.3 mg/dL (ref 0.2–1.2)

## 2016-05-09 LAB — AMYLASE: Amylase: 44 U/L (ref 0–105)

## 2016-05-09 LAB — GAMMA GT: GGT: 33 U/L (ref 7–51)

## 2016-05-09 LAB — LIPASE: LIPASE: 13 U/L (ref 7–60)

## 2016-05-09 LAB — VITAMIN D 25 HYDROXY (VIT D DEFICIENCY, FRACTURES): VIT D 25 HYDROXY: 39 ng/mL (ref 30–100)

## 2016-05-09 LAB — TSH: TSH: 0.82 mIU/L

## 2016-05-09 NOTE — Assessment & Plan Note (Signed)
Follow-up with your gastroenterologist or your general surgeon who has evaluated you in the past for your gallbladder.   Advised she seek specialist guidance since weight is under and she is unable to tolerate any fatty foods at all.

## 2016-05-09 NOTE — Assessment & Plan Note (Signed)
Patient counseled on being underweight and risks of Malnutrition, vitamin deficiencies etc.

## 2016-05-09 NOTE — Assessment & Plan Note (Signed)
Chronic symptoms secondary to meal ingestion.  Patient understands this is secondary to her gallbladder disease

## 2016-05-09 NOTE — Assessment & Plan Note (Signed)
On Valtrex daily

## 2016-05-09 NOTE — Assessment & Plan Note (Signed)
Not ready to quit- pre-contemplative phases.

## 2016-05-13 ENCOUNTER — Encounter: Payer: Self-pay | Admitting: Family Medicine

## 2016-05-19 ENCOUNTER — Encounter: Payer: Self-pay | Admitting: Family Medicine

## 2017-02-12 IMAGING — NM NM HEPATO W/GB/PHARM/[PERSON_NAME]
2 series · 12 of 12 positions shown · non-contrast
Comparison: None.

CLINICAL DATA: Right upper quadrant pain

EXAM:
NUCLEAR MEDICINE HEPATOBILIARY IMAGING WITH GALLBLADDER EF
TECHNIQUE: Sequential images of the abdomen were obtained [DATE] minutes
following intravenous administration of radiopharmaceutical. After
slow intravenous infusion of 0.81 micrograms Cholecystokinin,
gallbladder ejection fraction was determined.
RADIOPHARMACEUTICALS:  5.1 [place] Wc-KKm Choletec

[Series 1: biliary · 3.25mm/px · 6 of 60 frames shown]
[frame 6/60]
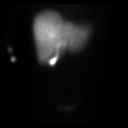
[frame 16/60]
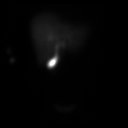
[frame 26/60]
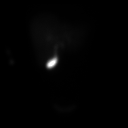
[frame 36/60]
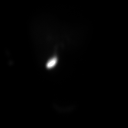
[frame 46/60]
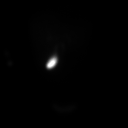
[frame 56/60]
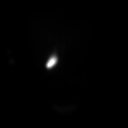

[Series 2: gbef · 3.25mm/px · 6 of 45 frames shown]
[frame 4/45]
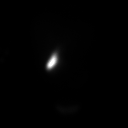
[frame 12/45]
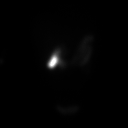
[frame 19/45]
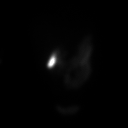
[frame 27/45]
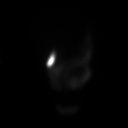
[frame 34/45]
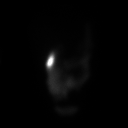
[frame 42/45]
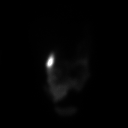

[12 of 12 positions shown; findings below may reference images not displayed]

FINDINGS: There is normal uptake of the tracer by the liver. Gallbladder
visualized at 25 minutes. CBD visualized at 45 minutes. Post CCK
gallbladder ejection fraction is 65%.. At 45 min, normal ejection
fraction is greater than 40%.

No symptoms post CCK administration.
IMPRESSION: No cystic duct obstruction. No CBD obstruction. Post CCK gallbladder
ejection fraction is 65%.

## 2019-02-28 ENCOUNTER — Other Ambulatory Visit: Payer: Self-pay

## 2019-02-28 DIAGNOSIS — Z20822 Contact with and (suspected) exposure to covid-19: Secondary | ICD-10-CM

## 2019-03-02 LAB — NOVEL CORONAVIRUS, NAA: SARS-CoV-2, NAA: NOT DETECTED

## 2023-08-05 ENCOUNTER — Ambulatory Visit: Payer: 59 | Admitting: Emergency Medicine

## 2023-08-05 ENCOUNTER — Encounter: Payer: Self-pay | Admitting: Emergency Medicine

## 2023-08-05 VITALS — BP 118/80 | HR 87 | Temp 98.2°F | Ht <= 58 in | Wt 96.0 lb

## 2023-08-05 DIAGNOSIS — Z13228 Encounter for screening for other metabolic disorders: Secondary | ICD-10-CM

## 2023-08-05 DIAGNOSIS — Z1329 Encounter for screening for other suspected endocrine disorder: Secondary | ICD-10-CM

## 2023-08-05 DIAGNOSIS — F17200 Nicotine dependence, unspecified, uncomplicated: Secondary | ICD-10-CM | POA: Diagnosis not present

## 2023-08-05 DIAGNOSIS — Z130 Encounter for screening for diseases of the blood and blood-forming organs and certain disorders involving the immune mechanism: Secondary | ICD-10-CM | POA: Diagnosis not present

## 2023-08-05 DIAGNOSIS — Z0000 Encounter for general adult medical examination without abnormal findings: Secondary | ICD-10-CM

## 2023-08-05 DIAGNOSIS — Z13220 Encounter for screening for lipoid disorders: Secondary | ICD-10-CM | POA: Diagnosis not present

## 2023-08-05 LAB — TSH: TSH: 1.64 u[IU]/mL (ref 0.35–5.50)

## 2023-08-05 LAB — LIPID PANEL
Cholesterol: 233 mg/dL — ABNORMAL HIGH (ref 0–200)
HDL: 103.6 mg/dL (ref >39.00)
LDL Cholesterol: 105 mg/dL — ABNORMAL HIGH (ref 0–99)
NonHDL: 129.03
Total CHOL/HDL Ratio: 2
Triglycerides: 122 mg/dL (ref 0.0–149.0)
VLDL: 24.4 mg/dL (ref 0.0–40.0)

## 2023-08-05 LAB — CBC WITH DIFFERENTIAL/PLATELET
Basophils Absolute: 0 10*3/uL (ref 0.0–0.1)
Basophils Relative: 0.4 % (ref 0.0–3.0)
Eosinophils Absolute: 0.1 10*3/uL (ref 0.0–0.7)
Eosinophils Relative: 1.3 % (ref 0.0–5.0)
HCT: 46 % (ref 36.0–46.0)
Hemoglobin: 15.6 g/dL — ABNORMAL HIGH (ref 12.0–15.0)
Lymphocytes Relative: 31.2 % (ref 12.0–46.0)
Lymphs Abs: 3.3 10*3/uL (ref 0.7–4.0)
MCHC: 33.8 g/dL (ref 30.0–36.0)
MCV: 95.9 fL (ref 78.0–100.0)
Monocytes Absolute: 0.9 10*3/uL (ref 0.1–1.0)
Monocytes Relative: 8.4 % (ref 3.0–12.0)
Neutro Abs: 6.3 10*3/uL (ref 1.4–7.7)
Neutrophils Relative %: 58.7 % (ref 43.0–77.0)
Platelets: 359 10*3/uL (ref 150.0–400.0)
RBC: 4.8 Mil/uL (ref 3.87–5.11)
RDW: 12.8 % (ref 11.5–15.5)
WBC: 10.7 10*3/uL — ABNORMAL HIGH (ref 4.0–10.5)

## 2023-08-05 LAB — COMPREHENSIVE METABOLIC PANEL
ALT: 19 U/L (ref 0–35)
AST: 20 U/L (ref 0–37)
Albumin: 4.8 g/dL (ref 3.5–5.2)
Alkaline Phosphatase: 57 U/L (ref 39–117)
BUN: 15 mg/dL (ref 6–23)
CO2: 27 meq/L (ref 19–32)
Calcium: 9.8 mg/dL (ref 8.4–10.5)
Chloride: 102 meq/L (ref 96–112)
Creatinine, Ser: 0.84 mg/dL (ref 0.40–1.20)
GFR: 83.11 mL/min (ref >60.00)
Glucose, Bld: 106 mg/dL — ABNORMAL HIGH (ref 70–99)
Potassium: 4.8 meq/L (ref 3.5–5.1)
Sodium: 138 meq/L (ref 135–145)
Total Bilirubin: 0.4 mg/dL (ref 0.2–1.2)
Total Protein: 7.5 g/dL (ref 6.0–8.3)

## 2023-08-05 LAB — HEMOGLOBIN A1C: Hgb A1c MFr Bld: 5.8 % (ref 4.6–6.5)

## 2023-08-05 LAB — VITAMIN D 25 HYDROXY (VIT D DEFICIENCY, FRACTURES): VITD: 24.75 ng/mL — ABNORMAL LOW (ref 30.00–100.00)

## 2023-08-05 LAB — VITAMIN B12: Vitamin B-12: 222 pg/mL (ref 211–911)

## 2023-08-05 NOTE — Patient Instructions (Signed)

## 2023-08-05 NOTE — Progress Notes (Signed)
Katherine Kidd 48 y.o.   Chief Complaint  Patient presents with   Establish Care    Patient states hasn't had a pcp for years. Just here to establish care     HISTORY OF PRESENT ILLNESS: This is a 48 y.o. female first visit to this office, here to establish care with me Current smoker No chronic medical conditions. Healthy diet.  Stays physically active. Has no complaints or medical concerns today. Sees gynecologist on a regular basis Taking prophylactic doses of valacyclovir daily  HPI   Prior to Admission medications   Medication Sig Start Date End Date Taking? Authorizing Provider  DiphenhydrAMINE HCl (BENADRYL ALLERGY PO) Take by mouth.    [provider]  ibuprofen (ADVIL,MOTRIN) 600 MG tablet Take 1 tablet (600 mg total) by mouth every 6 (six) hours as needed for mild pain. 03/08/14   Meisinger, Tawanna Cooler, MD  valACYclovir (VALTREX) 500 MG tablet Take 1 tablet by mouth daily. 04/19/16   [provider]    Allergies  Allergen Reactions   Amoxicillin Rash    Patient Active Problem List   Diagnosis Date Noted   Chronic Cholestasis symptoms  05/08/2016   Current smoker 05/08/2016   BMI less than 19,adult 05/08/2016    Past Medical History:  Diagnosis Date   Gallbladder attack    HSV-2 infection     Past Surgical History:  Procedure Laterality Date   CESAREAN SECTION N/A 03/06/2014   Procedure: CESAREAN SECTION;  Surgeon: Lavina Hamman, MD;  Location: [facility];  Service: Obstetrics;  Laterality: N/A;   lamienectomy     L5-S1   Septo Rhinoplasty     TONSILLECTOMY      Social History   Socioeconomic History   Marital status: Married    Spouse name: Not on file   Number of children: Not on file   Years of education: Not on file   Highest education level: Bachelor's degree (e.g., BA, AB, BS)  Occupational History   Not on file  Tobacco Use   Smoking status: Every Day    Current packs/day: 1.00    Average packs/day: 1 pack/day for 20.0  years (20.0 ttl pk-yrs)    Types: Cigarettes   Smokeless tobacco: Never  Substance and Sexual Activity   Alcohol use: Yes    Alcohol/week: 2.0 standard drinks of alcohol    Types: 2 Standard drinks or equivalent per week    Comment: occassionally   Drug use: No   Sexual activity: Not Currently    Birth control/protection: None, Rhythm, Pill  Other Topics Concern   Not on file  Social History Narrative   Not on file   Social Drivers of Health   Financial Resource Strain: Low Risk  (08/04/2023)   Overall Financial Resource Strain (CARDIA)    Difficulty of Paying Living Expenses: Not very hard  Food Insecurity: No Food Insecurity (08/04/2023)   Hunger Vital Sign    Worried About Running Out of Food in the Last Year: Never true    Ran Out of Food in the Last Year: Never true  Transportation Needs: No Transportation Needs (08/04/2023)   PRAPARE - Administrator, Civil Service (Medical): No    Lack of Transportation (Non-Medical): No  Physical Activity: Sufficiently Active (08/04/2023)   Exercise Vital Sign    Days of Exercise per Week: 3 days    Minutes of Exercise per Session: 50 min  Stress: No Stress Concern Present (08/04/2023)   [employer] of Occupational Health -  Occupational Stress Questionnaire    Feeling of Stress : Only a little  Social Connections: Moderately Integrated (08/04/2023)   Social Connection and Isolation Panel [NHANES]    Frequency of Communication with Friends and Family: More than three times a week    Frequency of Social Gatherings with Friends and Family: More than three times a week    Attends Religious Services: More than 4 times per year    Active Member of [employer] or Organizations: Yes    Attends Banker Meetings: 1 to 4 times per year    Marital Status: Divorced  Catering manager Violence: Not on file    Family History  Problem Relation Age of Onset   Breast cancer Mother 42       lump removed unsure if cancer    Hyperlipidemia Mother    Hypertension Mother    Cancer Father        kidney- renal cell   Hypertension Brother    Breast cancer Maternal Aunt 35   Cancer Maternal Aunt        colon   Heart disease Maternal Uncle    Breast cancer Paternal Aunt        Age unknown   Diabetes Paternal Uncle    Heart disease Maternal Grandfather    Diabetes Paternal Grandmother    Diabetes Paternal Grandfather      Review of Systems  Constitutional: Negative.  Negative for chills and fever.  HENT: Negative.  Negative for congestion and sore throat.   Respiratory: Negative.  Negative for cough and shortness of breath.   Cardiovascular: Negative.  Negative for chest pain and palpitations.  Gastrointestinal:  Negative for abdominal pain, diarrhea, nausea and vomiting.  Genitourinary: Negative.  Negative for dysuria and hematuria.  Skin: Negative.  Negative for rash.  Neurological: Negative.  Negative for dizziness and headaches.  All other systems reviewed and are negative.   Today's Vitals   08/05/23 0857  BP: 118/80  Pulse: 87  Temp: 98.2 F (36.8 C)  TempSrc: Oral  SpO2: 97%  Weight: 96 lb (43.5 kg)  Height: 4\' 10"  (1.473 m)   Body mass index is 20.06 kg/m.   Physical Exam Vitals reviewed.  Constitutional:      Appearance: Normal appearance.  HENT:     Head: Normocephalic.     Mouth/Throat:     Mouth: Mucous membranes are moist.     Pharynx: Oropharynx is clear.  Eyes:     Extraocular Movements: Extraocular movements intact.     Conjunctiva/sclera: Conjunctivae normal.     Pupils: Pupils are equal, round, and reactive to light.  Cardiovascular:     Rate and Rhythm: Normal rate and regular rhythm.     Pulses: Normal pulses.     Heart sounds: Normal heart sounds.  Pulmonary:     Effort: Pulmonary effort is normal.     Breath sounds: Normal breath sounds.  Musculoskeletal:     Cervical back: No tenderness.  Lymphadenopathy:     Cervical: No cervical adenopathy.  Skin:     General: Skin is warm and dry.     Capillary Refill: Capillary refill takes less than 2 seconds.  Neurological:     General: No focal deficit present.     Mental Status: She is alert and oriented to person, place, and time.  Psychiatric:        Mood and Affect: Mood normal.        Behavior: Behavior normal.  ASSESSMENT & PLAN: Problem List Items Addressed This Visit       Other   Current smoker   Other Visit Diagnoses       Routine general medical examination at a health care facility    -  Primary   Relevant Orders   CBC with Differential/Platelet   Comprehensive metabolic panel   Hemoglobin A1c   Lipid panel   Vitamin B12   VITAMIN D 25 Hydroxy (Vit-D Deficiency, Fractures)   TSH     Screening for deficiency anemia       Relevant Orders   CBC with Differential/Platelet     Screening for lipoid disorders       Relevant Orders   Lipid panel     Screening for endocrine, metabolic and immunity disorder       Relevant Orders   Comprehensive metabolic panel   Hemoglobin A1c   Vitamin B12   VITAMIN D 25 Hydroxy (Vit-D Deficiency, Fractures)   TSH      Modifiable risk factors discussed with patient. Anticipatory guidance according to age provided. The following topics were also discussed: Social Determinants of Health Smoking and cardiovascular/cancer risks associated with this condition.  Smoking cessation advice given Diet and nutrition Benefits of exercise Cancer family history review Vaccinations review and recommendations Cardiovascular risk assessment and need for blood work Mental health including depression and anxiety Fall and accident prevention  Patient Instructions  Health Maintenance, Female Adopting a healthy lifestyle and getting preventive care are important in promoting health and wellness. Ask your health care provider about: The right schedule for you to have regular tests and exams. Things you can do on your own to prevent diseases and  keep yourself healthy. What should I know about diet, weight, and exercise? Eat a healthy diet  Eat a diet that includes plenty of vegetables, fruits, low-fat dairy products, and lean protein. Do not eat a lot of foods that are high in solid fats, added sugars, or sodium. Maintain a healthy weight Body mass index (BMI) is used to identify weight problems. It estimates body fat based on height and weight. Your health care provider can help determine your BMI and help you achieve or maintain a healthy weight. Get regular exercise Get regular exercise. This is one of the most important things you can do for your health. Most adults should: Exercise for at least 150 minutes each week. The exercise should increase your heart rate and make you sweat (moderate-intensity exercise). Do strengthening exercises at least twice a week. This is in addition to the moderate-intensity exercise. Spend less time sitting. Even light physical activity can be beneficial. Watch cholesterol and blood lipids Have your blood tested for lipids and cholesterol at 47 years of age, then have this test every 5 years. Have your cholesterol levels checked more often if: Your lipid or cholesterol levels are high. You are older than 47 years of age. You are at high risk for heart disease. What should I know about cancer screening? Depending on your health history and family history, you may need to have cancer screening at various ages. This may include screening for: Breast cancer. Cervical cancer. Colorectal cancer. Skin cancer. Lung cancer. What should I know about heart disease, diabetes, and high blood pressure? Blood pressure and heart disease High blood pressure causes heart disease and increases the risk of stroke. This is more likely to develop in people who have high blood pressure readings or are overweight. Have your blood pressure checked:  Every 3-5 years if you are 3-47 years of age. Every year if you are  36 years old or older. Diabetes Have regular diabetes screenings. This checks your fasting blood sugar level. Have the screening done: Once every three years after age 46 if you are at a normal weight and have a low risk for diabetes. More often and at a younger age if you are overweight or have a high risk for diabetes. What should I know about preventing infection? Hepatitis B If you have a higher risk for hepatitis B, you should be screened for this virus. Talk with your health care provider to find out if you are at risk for hepatitis B infection. Hepatitis C Testing is recommended for: Everyone born from [birth year] through 1964. Anyone with known risk factors for hepatitis C. Sexually transmitted infections (STIs) Get screened for STIs, including gonorrhea and chlamydia, if: You are sexually active and are younger than 47 years of age. You are older than 47 years of age and your health care provider tells you that you are at risk for this type of infection. Your sexual activity has changed since you were last screened, and you are at increased risk for chlamydia or gonorrhea. Ask your health care provider if you are at risk. Ask your health care provider about whether you are at high risk for HIV. Your health care provider may recommend a prescription medicine to help prevent HIV infection. If you choose to take medicine to prevent HIV, you should first get tested for HIV. You should then be tested every 3 months for as long as you are taking the medicine. Pregnancy If you are about to stop having your period (premenopausal) and you may become pregnant, seek counseling before you get pregnant. Take 400 to 800 micrograms (mcg) of folic acid every day if you become pregnant. Ask for birth control (contraception) if you want to prevent pregnancy. Osteoporosis and menopause Osteoporosis is a disease in which the bones lose minerals and strength with aging. This can result in bone fractures. If you  are 32 years old or older, or if you are at risk for osteoporosis and fractures, ask your health care provider if you should: Be screened for bone loss. Take a calcium or vitamin D supplement to lower your risk of fractures. Be given hormone replacement therapy (HRT) to treat symptoms of menopause. Follow these instructions at home: Alcohol use Do not drink alcohol if: Your health care provider tells you not to drink. You are pregnant, may be pregnant, or are planning to become pregnant. If you drink alcohol: Limit how much you have to: 0-1 drink a day. Know how much alcohol is in your drink. In the [place] one drink equals one 12 oz bottle of beer (355 mL), one 5 oz glass of wine (148 mL), or one 1 oz glass of hard liquor (44 mL). Lifestyle Do not use any products that contain nicotine or tobacco. These products include cigarettes, chewing tobacco, and vaping devices, such as e-cigarettes. If you need help quitting, ask your health care provider. Do not use street drugs. Do not share needles. Ask your health care provider for help if you need support or information about quitting drugs. General instructions Schedule regular health, dental, and eye exams. Stay current with your vaccines. Tell your health care provider if: You often feel depressed. You have ever been abused or do not feel safe at home. Summary Adopting a healthy lifestyle and getting preventive care are important in  promoting health and wellness. Follow your health care provider's instructions about healthy diet, exercising, and getting tested or screened for diseases. Follow your health care provider's instructions on monitoring your cholesterol and blood pressure. This information is not intended to replace advice given to you by your health care provider. Make sure you discuss any questions you have with your health care provider. Document Revised: 11/18/2020 Document Reviewed: 11/18/2020 Elsevier Patient Education   2024 Elsevier Inc.      Edwina Barth, MD [facility]

## 2024-06-28 ENCOUNTER — Ambulatory Visit: Admitting: Emergency Medicine

## 2024-06-28 ENCOUNTER — Encounter: Payer: Self-pay | Admitting: Emergency Medicine

## 2024-06-28 ENCOUNTER — Ambulatory Visit: Payer: Self-pay | Admitting: Emergency Medicine

## 2024-06-28 VITALS — BP 118/80 | HR 67 | Temp 98.0°F | Ht <= 58 in | Wt 94.0 lb

## 2024-06-28 DIAGNOSIS — R42 Dizziness and giddiness: Secondary | ICD-10-CM | POA: Insufficient documentation

## 2024-06-28 DIAGNOSIS — F17200 Nicotine dependence, unspecified, uncomplicated: Secondary | ICD-10-CM

## 2024-06-28 DIAGNOSIS — G8929 Other chronic pain: Secondary | ICD-10-CM | POA: Insufficient documentation

## 2024-06-28 DIAGNOSIS — M542 Cervicalgia: Secondary | ICD-10-CM

## 2024-06-28 LAB — CBC WITH DIFFERENTIAL/PLATELET
Basophils Absolute: 0.2 K/uL — ABNORMAL HIGH (ref 0.0–0.1)
Basophils Relative: 1.3 % (ref 0.0–3.0)
Eosinophils Absolute: 0.1 K/uL (ref 0.0–0.7)
Eosinophils Relative: 0.9 % (ref 0.0–5.0)
HCT: 42.1 % (ref 36.0–46.0)
Hemoglobin: 14.6 g/dL (ref 12.0–15.0)
Lymphocytes Relative: 34.1 % (ref 12.0–46.0)
Lymphs Abs: 4.9 K/uL — ABNORMAL HIGH (ref 0.7–4.0)
MCHC: 34.8 g/dL (ref 30.0–36.0)
MCV: 94.2 fl (ref 78.0–100.0)
Monocytes Absolute: 1.2 K/uL — ABNORMAL HIGH (ref 0.1–1.0)
Monocytes Relative: 8.5 % (ref 3.0–12.0)
Neutro Abs: 7.9 K/uL — ABNORMAL HIGH (ref 1.4–7.7)
Neutrophils Relative %: 55.2 % (ref 43.0–77.0)
Platelets: 409 K/uL — ABNORMAL HIGH (ref 150.0–400.0)
RBC: 4.47 Mil/uL (ref 3.87–5.11)
RDW: 13.5 % (ref 11.5–15.5)
WBC: 14.3 K/uL — ABNORMAL HIGH (ref 4.0–10.5)

## 2024-06-28 LAB — COMPREHENSIVE METABOLIC PANEL WITH GFR
ALT: 16 U/L (ref 3–35)
AST: 15 U/L (ref 5–37)
Albumin: 4.8 g/dL (ref 3.5–5.2)
Alkaline Phosphatase: 66 U/L (ref 39–117)
BUN: 15 mg/dL (ref 6–23)
CO2: 28 meq/L (ref 19–32)
Calcium: 9.4 mg/dL (ref 8.4–10.5)
Chloride: 100 meq/L (ref 96–112)
Creatinine, Ser: 0.75 mg/dL (ref 0.40–1.20)
GFR: 94.62 mL/min (ref >60.00)
Glucose, Bld: 89 mg/dL (ref 70–99)
Potassium: 3.8 meq/L (ref 3.5–5.1)
Sodium: 139 meq/L (ref 135–145)
Total Bilirubin: 0.4 mg/dL (ref 0.2–1.2)
Total Protein: 7.4 g/dL (ref 6.0–8.3)

## 2024-06-28 NOTE — Assessment & Plan Note (Signed)
 Clinical history not typical of BPPV. Lightheadedness and vertigo triggered by pain from neck movement Blood pressure within normal limits today We will do blood work today Symptom management discussed

## 2024-06-28 NOTE — Assessment & Plan Note (Signed)
 Cardiovascular and cancer risks associated with smoking discussed Smoking cessation advice given Contributing to capillary fragility along with frequent use of NSAIDs

## 2024-06-28 NOTE — Patient Instructions (Signed)

## 2024-06-28 NOTE — Progress Notes (Signed)
 Katherine Kidd 48 y.o.   Chief Complaint  Patient presents with   Dizziness    Pt states that she may have vertigo she has been experiencing some dizziness or feels heavy to where she had to lift her head while laying in the bed because she could lift it by herself. She also has a blood vessel that may have burst and patient states that it is now big bruise on the right breast.She also state  that she has to use a neck pillow to keep her elevated     HISTORY OF PRESENT ILLNESS: This is a 48 y.o. female complaining of chronic neck pain for the last 9 to 10 months States she had MRI done last April.  Had been seeing orthopedist and neurosurgeon Recent orthopedic evaluation for neck pain disclosed finding of vertigo.  Was referred here for possible BPPV Patient states she gets lightheaded and dizzy when she turns her neck and gets pain.  Pain triggers her symptoms. Also complaining of blood vessel on her left breast that recently rupture left a bruise Blood pressure was elevated yesterday No other complaints or medical concerns today.  Dizziness Associated symptoms include neck pain. Pertinent negatives include no abdominal pain, chest pain, chills, congestion, coughing, fever, nausea, rash, sore throat or vomiting.     Prior to Admission medications  Medication Sig Start Date End Date Taking? Authorizing Provider  DiphenhydrAMINE  HCl (BENADRYL  ALLERGY PO) Take by mouth.   Yes [provider]  ibuprofen  (ADVIL ,MOTRIN ) 600 MG tablet Take 1 tablet (600 mg total) by mouth every 6 (six) hours as needed for mild pain. 03/08/14  Yes Meisinger, Todd, MD  valACYclovir  (VALTREX ) 500 MG tablet Take 1 tablet by mouth daily. 04/19/16  Yes [provider]  predniSONE (DELTASONE) 10 MG tablet TAKE 6 TABLETS BY MOUTH FOR 2 DAYS, THEN 5 TABLETS FOR 2 DAYS, THEN 4 TABLETS FOR 2 DAYS, THEN 3 TABLETS FOR 2 DAYS, THEN 2 TABLETS FOR 2 DAYS, THEN 1 TABLET FOR 2 DAYS. TAKE WITH FOOD    [provider]    Allergies[1]  Patient Active Problem List   Diagnosis Date Noted   Chronic neck pain 06/28/2024   Vertigo 06/28/2024   Chronic Cholestasis symptoms  05/08/2016   Current smoker 05/08/2016   BMI less than 19,adult 05/08/2016    Past Medical History:  Diagnosis Date   Gallbladder attack    HSV-2 infection     Past Surgical History:  Procedure Laterality Date   CESAREAN SECTION N/A 03/06/2014   Procedure: CESAREAN SECTION;  Surgeon: Krystal Deaner, MD;  Location: [facility];  Service: Obstetrics;  Laterality: N/A;   lamienectomy     L5-S1   Septo Rhinoplasty     TONSILLECTOMY      Social History   Socioeconomic History   Marital status: Divorced    Spouse name: Not on file   Number of children: Not on file   Years of education: Not on file   Highest education level: Bachelor's degree (e.g., BA, AB, BS)  Occupational History   Occupation: Public affairs consultant at [employer]  Tobacco Use   Smoking status: Every Day    Current packs/day: 1.00    Average packs/day: 1 pack/day for 20.0 years (20.0 ttl pk-yrs)    Types: Cigarettes   Smokeless tobacco: Never  Vaping Use   Vaping status: Never Used  Substance and Sexual Activity   Alcohol use: Yes    Alcohol/week: 4.0 standard drinks of alcohol  Types: 2 Cans of beer, 2 Standard drinks or equivalent per week    Comment: occassionally   Drug use: No   Sexual activity: Yes    Birth control/protection: None  Other Topics Concern   Not on file  Social History Narrative   Not on file   Social Drivers of Health   Tobacco Use: High Risk (06/28/2024)   Patient History    Smoking Tobacco Use: Every Day    Smokeless Tobacco Use: Never    Passive Exposure: Not on file  Financial Resource Strain: Low Risk (08/04/2023)   Overall Financial Resource Strain (CARDIA)    Difficulty of Paying Living Expenses: Not very hard  Food Insecurity: No Food Insecurity (08/04/2023)   Hunger Vital Sign    Worried About Running Out  of Food in the Last Year: Never true    Ran Out of Food in the Last Year: Never true  Transportation Needs: No Transportation Needs (08/04/2023)   PRAPARE - Administrator, Civil Service (Medical): No    Lack of Transportation (Non-Medical): No  Physical Activity: Sufficiently Active (08/04/2023)   Exercise Vital Sign    Days of Exercise per Week: 3 days    Minutes of Exercise per Session: 50 min  Stress: No Stress Concern Present (08/04/2023)   [employer] of Occupational Health - Occupational Stress Questionnaire    Feeling of Stress : Only a little  Social Connections: Moderately Integrated (08/04/2023)   Social Connection and Isolation Panel    Frequency of Communication with Friends and Family: More than three times a week    Frequency of Social Gatherings with Friends and Family: More than three times a week    Attends Religious Services: More than 4 times per year    Active Member of Clubs or Organizations: Yes    Attends Banker Meetings: 1 to 4 times per year    Marital Status: Divorced  Catering Manager Violence: Not on file  Depression (PHQ2-9): Low Risk (08/05/2023)   Depression (PHQ2-9)    PHQ-2 Score: 0  Alcohol Screen: Low Risk (08/04/2023)   Alcohol Screen    Last Alcohol Screening Score (AUDIT): 3  Housing: Low Risk (08/04/2023)   Housing Stability Vital Sign    Unable to Pay for Housing in the Last Year: No    Number of Times Moved in the Last Year: 0    Homeless in the Last Year: No  Utilities: Not on file  Health Literacy: Not on file    Family History  Problem Relation Age of Onset   Breast cancer Mother 7       lump removed unsure if cancer   Hyperlipidemia Mother    Hypertension Mother    Cancer Father        kidney- renal cell   Hypertension Brother    Breast cancer Maternal Aunt 35   Cancer Maternal Aunt        colon   Heart disease Maternal Uncle    Breast cancer Paternal Aunt        Age unknown   Diabetes Paternal  Uncle    Heart disease Maternal Grandfather    Diabetes Paternal Grandmother    Diabetes Paternal Grandfather      Review of Systems  Constitutional: Negative.  Negative for chills and fever.  HENT: Negative.  Negative for congestion and sore throat.   Respiratory: Negative.  Negative for cough and shortness of breath.   Cardiovascular: Negative.  Negative for  chest pain and palpitations.  Gastrointestinal:  Negative for abdominal pain, diarrhea, nausea and vomiting.  Genitourinary: Negative.  Negative for dysuria and hematuria.  Musculoskeletal:  Positive for neck pain.  Skin: Negative.  Negative for rash.  Neurological:  Positive for dizziness. Negative for sensory change, speech change, focal weakness and loss of consciousness.  All other systems reviewed and are negative.   Vitals:   06/28/24 1511  BP: 118/80  Pulse: 67  Temp: 98 F (36.7 C)  SpO2: 98%    Physical Exam Vitals reviewed.  Constitutional:      Appearance: Normal appearance.  HENT:     Head: Normocephalic.     Right Ear: Tympanic membrane, ear canal and external ear normal.     Left Ear: Tympanic membrane, ear canal and external ear normal.     Mouth/Throat:     Mouth: Mucous membranes are moist.     Pharynx: Oropharynx is clear.  Eyes:     Extraocular Movements: Extraocular movements intact.     Conjunctiva/sclera: Conjunctivae normal.     Pupils: Pupils are equal, round, and reactive to light.  Cardiovascular:     Rate and Rhythm: Normal rate and regular rhythm.     Pulses: Normal pulses.     Heart sounds: Normal heart sounds.  Pulmonary:     Effort: Pulmonary effort is normal.     Breath sounds: Normal breath sounds.  Musculoskeletal:     Cervical back: No tenderness.  Lymphadenopathy:     Cervical: No cervical adenopathy.  Skin:    General: Skin is warm and dry.     Capillary Refill: Capillary refill takes less than 2 seconds.  Neurological:     General: No focal deficit present.      Mental Status: She is alert and oriented to person, place, and time.     Sensory: No sensory deficit.     Motor: No weakness.  Psychiatric:        Mood and Affect: Mood normal.        Behavior: Behavior normal.      ASSESSMENT & PLAN: Problem List Items Addressed This Visit       Other   Current smoker   Cardiovascular and cancer risks associated with smoking discussed Smoking cessation advice given Contributing to capillary fragility along with frequent use of NSAIDs      Chronic neck pain   MRI report not available on epic.  Unable to review Pain management discussed Needs to follow-up with orthopedist/neurosurgeon to reevaluate treatment options.  Scheduled for physical therapy next week.      Relevant Medications   predniSONE (DELTASONE) 10 MG tablet   Vertigo - Primary   Clinical history not typical of BPPV. Lightheadedness and vertigo triggered by pain from neck movement Blood pressure within normal limits today We will do blood work today Symptom management discussed      Relevant Orders   CBC with Differential/Platelet   Comprehensive metabolic panel with GFR   Patient Instructions  Health Maintenance, Female Adopting a healthy lifestyle and getting preventive care are important in promoting health and wellness. Ask your health care provider about: The right schedule for you to have regular tests and exams. Things you can do on your own to prevent diseases and keep yourself healthy. What should I know about diet, weight, and exercise? Eat a healthy diet  Eat a diet that includes plenty of vegetables, fruits, low-fat dairy products, and lean protein. Do not eat a lot of  foods that are high in solid fats, added sugars, or sodium. Maintain a healthy weight Body mass index (BMI) is used to identify weight problems. It estimates body fat based on height and weight. Your health care provider can help determine your BMI and help you achieve or maintain a healthy  weight. Get regular exercise Get regular exercise. This is one of the most important things you can do for your health. Most adults should: Exercise for at least 150 minutes each week. The exercise should increase your heart rate and make you sweat (moderate-intensity exercise). Do strengthening exercises at least twice a week. This is in addition to the moderate-intensity exercise. Spend less time sitting. Even light physical activity can be beneficial. Watch cholesterol and blood lipids Have your blood tested for lipids and cholesterol at 47 years of age, then have this test every 5 years. Have your cholesterol levels checked more often if: Your lipid or cholesterol levels are high. You are older than 47 years of age. You are at high risk for heart disease. What should I know about cancer screening? Depending on your health history and family history, you may need to have cancer screening at various ages. This may include screening for: Breast cancer. Cervical cancer. Colorectal cancer. Skin cancer. Lung cancer. What should I know about heart disease, diabetes, and high blood pressure? Blood pressure and heart disease High blood pressure causes heart disease and increases the risk of stroke. This is more likely to develop in people who have high blood pressure readings or are overweight. Have your blood pressure checked: Every 3-5 years if you are 77-41 years of age. Every year if you are 53 years old or older. Diabetes Have regular diabetes screenings. This checks your fasting blood sugar level. Have the screening done: Once every three years after age 75 if you are at a normal weight and have a low risk for diabetes. More often and at a younger age if you are overweight or have a high risk for diabetes. What should I know about preventing infection? Hepatitis B If you have a higher risk for hepatitis B, you should be screened for this virus. Talk with your health care provider to  find out if you are at risk for hepatitis B infection. Hepatitis C Testing is recommended for: Everyone born from 18 through 1965. Anyone with known risk factors for hepatitis C. Sexually transmitted infections (STIs) Get screened for STIs, including gonorrhea and chlamydia, if: You are sexually active and are younger than 47 years of age. You are older than 46 years of age and your health care provider tells you that you are at risk for this type of infection. Your sexual activity has changed since you were last screened, and you are at increased risk for chlamydia or gonorrhea. Ask your health care provider if you are at risk. Ask your health care provider about whether you are at high risk for HIV. Your health care provider may recommend a prescription medicine to help prevent HIV infection. If you choose to take medicine to prevent HIV, you should first get tested for HIV. You should then be tested every 3 months for as long as you are taking the medicine. Pregnancy If you are about to stop having your period (premenopausal) and you may become pregnant, seek counseling before you get pregnant. Take 400 to 800 micrograms (mcg) of folic acid every day if you become pregnant. Ask for birth control (contraception) if you want to prevent pregnancy. Osteoporosis  and menopause Osteoporosis is a disease in which the bones lose minerals and strength with aging. This can result in bone fractures. If you are 64 years old or older, or if you are at risk for osteoporosis and fractures, ask your health care provider if you should: Be screened for bone loss. Take a calcium or vitamin D  supplement to lower your risk of fractures. Be given hormone replacement therapy (HRT) to treat symptoms of menopause. Follow these instructions at home: Alcohol use Do not drink alcohol if: Your health care provider tells you not to drink. You are pregnant, may be pregnant, or are planning to become pregnant. If you  drink alcohol: Limit how much you have to: 0-1 drink a day. Know how much alcohol is in your drink. In the [place] one drink equals one 12 oz bottle of beer (355 mL), one 5 oz glass of wine (148 mL), or one 1 oz glass of hard liquor (44 mL). Lifestyle Do not use any products that contain nicotine or tobacco. These products include cigarettes, chewing tobacco, and vaping devices, such as e-cigarettes. If you need help quitting, ask your health care provider. Do not use street drugs. Do not share needles. Ask your health care provider for help if you need support or information about quitting drugs. General instructions Schedule regular health, dental, and eye exams. Stay current with your vaccines. Tell your health care provider if: You often feel depressed. You have ever been abused or do not feel safe at home. Summary Adopting a healthy lifestyle and getting preventive care are important in promoting health and wellness. Follow your health care provider's instructions about healthy diet, exercising, and getting tested or screened for diseases. Follow your health care provider's instructions on monitoring your cholesterol and blood pressure. This information is not intended to replace advice given to you by your health care provider. Make sure you discuss any questions you have with your health care provider. Document Revised: 11/18/2020 Document Reviewed: 11/18/2020 Elsevier Patient Education  2024 [employer]      Emil Schaumann, MD [facility]     [1]  Allergies Allergen Reactions   Amoxicillin Rash

## 2024-06-28 NOTE — Assessment & Plan Note (Signed)
 MRI report not available on epic.  Unable to review Pain management discussed Needs to follow-up with orthopedist/neurosurgeon to reevaluate treatment options.  Scheduled for physical therapy next week.

## 2024-06-29 NOTE — Telephone Encounter (Signed)
Please advise if this [place]

## 2024-06-29 NOTE — Telephone Encounter (Signed)
 Okay to schedule annual early next year
